# Patient Record
Sex: Female | Born: 1955 | Race: White | Hispanic: No | Marital: Married | Smoking: Current every day smoker
Health system: Southern US, Community
[De-identification: ages and names within clinical notes are randomized; demographics above are authoritative.]

## PROBLEM LIST (undated history)

## (undated) DIAGNOSIS — B029 Zoster without complications: Secondary | ICD-10-CM

## (undated) DIAGNOSIS — A630 Anogenital (venereal) warts: Secondary | ICD-10-CM

## (undated) DIAGNOSIS — E7800 Pure hypercholesterolemia, unspecified: Secondary | ICD-10-CM

## (undated) DIAGNOSIS — Z79890 Hormone replacement therapy (postmenopausal): Secondary | ICD-10-CM

## (undated) DIAGNOSIS — R7611 Nonspecific reaction to tuberculin skin test without active tuberculosis: Secondary | ICD-10-CM

## (undated) DIAGNOSIS — F17200 Nicotine dependence, unspecified, uncomplicated: Secondary | ICD-10-CM

## (undated) HISTORY — DX: Nicotine dependence, unspecified, uncomplicated: F17.200

## (undated) HISTORY — DX: Hormone replacement therapy: Z79.890

## (undated) HISTORY — DX: Nonspecific reaction to tuberculin skin test without active tuberculosis: R76.11

## (undated) HISTORY — DX: Anogenital (venereal) warts: A63.0

## (undated) HISTORY — DX: Pure hypercholesterolemia, unspecified: E78.00

## (undated) HISTORY — DX: Zoster without complications: B02.9

---

## 1994-02-07 HISTORY — PX: ESOPHAGUS SURGERY: SHX626

## 2001-02-07 HISTORY — PX: TONSILLECTOMY AND ADENOIDECTOMY: SUR1326

## 2001-02-07 HISTORY — PX: APPENDECTOMY: SHX54

## 2003-02-08 DIAGNOSIS — B029 Zoster without complications: Secondary | ICD-10-CM

## 2003-02-08 HISTORY — DX: Zoster without complications: B02.9

## 2003-02-08 LAB — HM DEXA SCAN

## 2006-09-07 ENCOUNTER — Ambulatory Visit: Payer: Self-pay

## 2007-11-13 ENCOUNTER — Ambulatory Visit: Payer: Self-pay | Admitting: Family Medicine

## 2008-09-09 LAB — HM PAP SMEAR: HM Pap smear: NORMAL

## 2011-11-22 ENCOUNTER — Ambulatory Visit (INDEPENDENT_AMBULATORY_CARE_PROVIDER_SITE_OTHER): Payer: BC Managed Care – PPO | Admitting: Family Medicine

## 2011-11-22 ENCOUNTER — Encounter: Payer: Self-pay | Admitting: Family Medicine

## 2011-11-22 VITALS — BP 140/84 | HR 94 | Temp 98.1°F | Resp 16 | Ht 65.0 in | Wt 125.0 lb

## 2011-11-22 DIAGNOSIS — R319 Hematuria, unspecified: Secondary | ICD-10-CM

## 2011-11-22 DIAGNOSIS — L989 Disorder of the skin and subcutaneous tissue, unspecified: Secondary | ICD-10-CM

## 2011-11-22 DIAGNOSIS — F419 Anxiety disorder, unspecified: Secondary | ICD-10-CM

## 2011-11-22 DIAGNOSIS — Z01419 Encounter for gynecological examination (general) (routine) without abnormal findings: Secondary | ICD-10-CM

## 2011-11-22 DIAGNOSIS — Z0000 Encounter for general adult medical examination without abnormal findings: Secondary | ICD-10-CM

## 2011-11-22 DIAGNOSIS — Z780 Asymptomatic menopausal state: Secondary | ICD-10-CM

## 2011-11-22 DIAGNOSIS — Z23 Encounter for immunization: Secondary | ICD-10-CM

## 2011-11-22 LAB — COMPREHENSIVE METABOLIC PANEL
AST: 17 U/L (ref 0–37)
Alkaline Phosphatase: 78 U/L (ref 39–117)
BUN: 10 mg/dL (ref 6–23)
Creat: 0.84 mg/dL (ref 0.50–1.10)
Glucose, Bld: 96 mg/dL (ref 70–99)
Total Bilirubin: 0.5 mg/dL (ref 0.3–1.2)

## 2011-11-22 LAB — POCT URINALYSIS DIPSTICK
Bilirubin, UA: NEGATIVE
Ketones, UA: NEGATIVE
Protein, UA: NEGATIVE
pH, UA: 5.5

## 2011-11-22 LAB — IFOBT (OCCULT BLOOD): IFOBT: NEGATIVE

## 2011-11-22 LAB — POCT UA - MICROSCOPIC ONLY
Crystals, Ur, HPF, POC: NEGATIVE
Yeast, UA: NEGATIVE

## 2011-11-22 LAB — LIPID PANEL
Cholesterol: 208 mg/dL — ABNORMAL HIGH (ref 0–200)
HDL: 66 mg/dL (ref >39)
Total CHOL/HDL Ratio: 3.2 Ratio
Triglycerides: 78 mg/dL (ref <150)
VLDL: 16 mg/dL (ref 0–40)

## 2011-11-22 LAB — CBC
HCT: 42.1 % (ref 36.0–46.0)
Hemoglobin: 14.2 g/dL (ref 12.0–15.0)
MCH: 29.2 pg (ref 26.0–34.0)
MCHC: 33.7 g/dL (ref 30.0–36.0)
RDW: 13.7 % (ref 11.5–15.5)

## 2011-11-22 LAB — HEMOGLOBIN A1C: Mean Plasma Glucose: 114 mg/dL (ref <117)

## 2011-11-22 MED ORDER — HYDROXYZINE HCL 25 MG PO TABS: ORAL_TABLET | ORAL | Status: DC

## 2011-11-22 MED ORDER — MEDROXYPROGESTERONE ACETATE 2.5 MG PO TABS: 2.5000 mg | ORAL_TABLET | Freq: Every day | ORAL | Status: DC

## 2011-11-22 MED ORDER — ESTRADIOL 0.5 MG PO TABS: 0.5000 mg | ORAL_TABLET | Freq: Every day | ORAL | Status: DC

## 2011-11-22 NOTE — Progress Notes (Signed)
[address]   [place], [place]  [address]   [phone]  Subjective:    Patient ID: Lindsey Rich, female    DOB: 07-13-1955, 56 y.o.   MRN: [id number]  HPIThis 56 y.o. female presents to establish care and for CPE.  Last physical 09/2008.  Pap smear 09/2008 normal HPV negative.  Mammogram 11/2007.  Colonoscopy never.  TDAP 2007.  Flu vaccine yearly.  Eye exam 2012; +glasses; no glaucoma or cataracts.  Dental exam 2006.    1.  Anxiety: onset eight months ago; three moves in past year.  Husband's job has moved him three times in past year.  Had to get rid of dog because of moves; [employer].  Intolerant to negative; becomes anxious.  Anxious while driving; constantly thinking; insomnia.  No HRT in past month.  Wakes up and has joint aches..+fatigue.  Getting seven hours of sleep per night.      Review of Systems  Constitutional: Negative for fever, chills, diaphoresis, activity change, appetite change, fatigue and unexpected weight change.  HENT: Negative for hearing loss, ear pain, nosebleeds, congestion, sore throat, facial swelling, rhinorrhea, sneezing, drooling, mouth sores, trouble swallowing, neck pain, neck stiffness, dental problem, voice change, postnasal drip, sinus pressure, tinnitus and ear discharge.   Eyes: Negative for photophobia, pain, discharge, redness, itching and visual disturbance.  Respiratory: Negative for apnea, cough, choking, chest tightness, shortness of breath, wheezing and stridor.   Cardiovascular: Negative for chest pain, palpitations and leg swelling.  Gastrointestinal: Negative for nausea, vomiting, abdominal pain, diarrhea, constipation, blood in stool, abdominal distention, anal bleeding and rectal pain.  Genitourinary: Negative for dysuria, urgency, frequency, hematuria, flank pain, decreased urine volume, vaginal bleeding, vaginal discharge, enuresis, difficulty urinating, genital sores, vaginal pain, menstrual problem, pelvic pain and dyspareunia.    Musculoskeletal: Negative for myalgias, back pain, joint swelling, arthralgias and gait problem.  Skin: Negative for color change, pallor, rash and wound.  Neurological: Negative for dizziness, tremors, seizures, syncope, facial asymmetry, speech difficulty, weakness, light-headedness, numbness and headaches.  Hematological: Negative for adenopathy. Does not bruise/bleed easily.  Psychiatric/Behavioral: Positive for sleep disturbance. Negative for suicidal ideas, hallucinations, behavioral problems, confusion, self-injury, dysphoric mood, decreased concentration and agitation. The patient is nervous/anxious. The patient is not hyperactive.         Past Medical History  Diagnosis Date  . Pure hypercholesterolemia   . Tobacco use disorder   . 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 Need for prophylactic hormone replacement therapy (postmenopausal) age 44    postmenopausal  . Genital warts   . Shingles 2005    Past Surgical History  Procedure Date  . Appendectomy 2003  . Esophagus surgery 1996  . Tonsillectomy and adenoidectomy 2003    Prior to Admission medications   Medication Sig Start Date End Date Taking? Authorizing Provider  cholecalciferol (VITAMIN D) 1000 UNITS tablet Take 1,000 Units by mouth daily.    Historical Provider, MD  estradiol (ESTRACE) 0.5 MG tablet Take 1 tablet (0.5 mg total) by mouth daily. 11/22/11  Yes Ethelda Chick, MD  hydrOXYzine (ATARAX/VISTARIL) 25 MG tablet 1/2 to 1 three times daily PRN anxiety 11/22/11   Ethelda Chick, MD  medroxyPROGESTERone (PROVERA) 2.5 MG tablet Take 1 tablet (2.5 mg total) by mouth daily. 11/22/11  Yes Ethelda Chick, MD    No Known Allergies  History   Social History  . Marital Status: Married    Spouse Name: N/A    Number  of Children: 2  . Years of Education: 12   Occupational History  . housekeeper    Social History Main Topics  . Smoking status: Current Every Day Smoker  . Smokeless tobacco: Not on file  .  Alcohol Use: No  . Drug Use: No  . Sexually Active: Not on file   Other Topics Concern  . Not on file   Social History Narrative   Married x 26 years. Happily married no abuse.Caffeine use: drinks four glasses of tea per ay. Exercise: Walks 1-3 times per week; weights  X 3 days.    Family History  Problem Relation Age of Onset  . Alcohol abuse Mother   . Heart attack Father   . Heart disease Father   . Hyperlipidemia Father   . Heart attack Maternal Grandmother   . Stroke Maternal Grandmother   . Cancer Sister     lung  . Hypertension Sister   . Cancer Sister   . Thyroid disease Sister   . Thyroid disease Brother   . Hyperlipidemia Brother     Objective:   Physical Exam  Nursing note and vitals reviewed. Constitutional: She is oriented to person, place, and time. She appears well-developed and well-nourished. No distress.  HENT:  Head: Normocephalic and atraumatic.  Right Ear: External ear normal.  Left Ear: External ear normal.  Nose: Nose normal.  Mouth/Throat: Oropharynx is clear and moist.  Eyes: Conjunctivae normal and EOM are normal. Pupils are equal, round, and reactive to light.  Neck: Normal range of motion. Neck supple. No JVD present. No tracheal deviation present. No thyromegaly present.  Cardiovascular: Normal rate, regular rhythm, normal heart sounds and intact distal pulses.  Exam reveals no gallop and no friction rub.   No murmur heard. Pulmonary/Chest: Effort normal and breath sounds normal. No respiratory distress. She has no wheezes. She has no rales.  Abdominal: Soft. Bowel sounds are normal. She exhibits no distension and no mass. There is tenderness. There is no rebound and no guarding.  Genitourinary: Vagina normal and uterus normal.  Musculoskeletal: Normal range of motion.  Lymphadenopathy:    She has no cervical adenopathy.  Neurological: She is alert and oriented to person, place, and time. She has normal reflexes.  Skin: Skin is warm and  dry. She is not diaphoretic.  Psychiatric: She has a normal mood and affect. Her behavior is normal. Judgment and thought content normal.    INFLUENZA VACCINE ADMINISTERED.    Assessment & Plan:   1. Routine general medical examination at a health care facility  POCT UA - Microscopic Only, POCT urinalysis dipstick, CBC, Comprehensive metabolic panel, Hemoglobin A1c, Lipid panel, TSH, T4, free, Vitamin B12, Folate, Vitamin D 25 hydroxy, EKG 12-Lead, Pulse oximetry (single), Pulmonary function test, IFOBT POC (occult bld, rslt in office)  2. Need for influenza vaccination  Flu vaccine greater than or equal to 3yo preservative free IM  3. Anxiety  hydrOXYzine (ATARAX/VISTARIL) 25 MG tablet  4. Menopause  estradiol (ESTRACE) 0.5 MG tablet, medroxyPROGESTERone (PROVERA) 2.5 MG tablet  5. Routine gynecological examination  MM Digital Screening, Pap IG and HPV (high risk) DNA detection  6. Hematuria  Urine culture  7. Skin lesion  Ambulatory referral to Dermatology     1.  CPE:  Anticipatory guidance --- exercise, weight maintenance, smoking cessation. Pap smear obtained; refer for mammogram.    S/p influenza vaccine in office.  Counseled on colon cancer screening; hemosure obtained in office.  Obtain labs. 2. Gynecology exam:  Completed; pap smear obtained; refer for mammogram. 3. Menopause:  Uncontrolled currently; counseled in HRT at length; refill of Estrace and Provera provided. 4.  Hematuria:  New. Send urine culture.  If culture negative, will need follow-up visit in 2-4 weeks to confirm resolution.   5.  Anxiety:  New.  Intermittent; agreeable to trial of Hydroxyzine PRN.  If needing daily, highly encourage psychotherapy and SSRI. 6.  Skin Lesion:  New.  Refer to dermatology for skin survey.    Meds ordered this encounter  Medications  . DISCONTD: medroxyPROGESTERone (PROVERA) 2.5 MG tablet    Sig: Take 2.5 mg by mouth daily.  Marland Kitchen DISCONTD: estradiol (ESTRACE) 0.5 MG tablet    Sig: Take  0.5 mg by mouth daily.  . hydrOXYzine (ATARAX/VISTARIL) 25 MG tablet    Sig: 1/2 to 1 three times daily PRN anxiety    Dispense:  60 tablet    Refill:  3  . estradiol (ESTRACE) 0.5 MG tablet    Sig: Take 1 tablet (0.5 mg total) by mouth daily.    Dispense:  30 tablet    Refill:  11  . medroxyPROGESTERone (PROVERA) 2.5 MG tablet    Sig: Take 1 tablet (2.5 mg total) by mouth daily.    Dispense:  30 tablet    Refill:  11

## 2011-11-23 LAB — T4, FREE: Free T4: 1.17 ng/dL (ref 0.80–1.80)

## 2011-11-23 LAB — FOLATE: Folate: >20 ng/mL

## 2011-11-24 LAB — URINE CULTURE: Colony Count: 7000

## 2011-11-24 LAB — PAP IG AND HPV HIGH-RISK

## 2011-11-27 ENCOUNTER — Telehealth: Payer: Self-pay

## 2011-11-27 NOTE — Telephone Encounter (Signed)
Patient returning call for lab results from OV on 10/15. She can be reached at [phone].

## 2011-11-28 NOTE — Telephone Encounter (Signed)
The lab has contacted her since this message was taken.

## 2011-12-02 ENCOUNTER — Encounter: Payer: Self-pay | Admitting: *Deleted

## 2011-12-30 ENCOUNTER — Encounter: Payer: Self-pay | Admitting: Family Medicine

## 2012-01-24 NOTE — Progress Notes (Signed)
Reviewed and agree.

## 2012-03-05 ENCOUNTER — Telehealth: Payer: Self-pay | Admitting: *Deleted

## 2012-03-05 NOTE — Telephone Encounter (Signed)
Please call pt --- why does she take Acyclovir?  Last filled at pharmacy 08/2011.

## 2012-03-05 NOTE — Telephone Encounter (Signed)
[facility] [place] requesting a refill on acyclovir 200mg  cap.  Last fill 08/10/11

## 2012-03-06 NOTE — Telephone Encounter (Signed)
Pt reports that she has had recurrent episodes of shingles and when she was seeing Dr Leanna Battles (?) at [facility] she had Rxd acyclovir for pt to take when she feels the Sxs of shingles beginning. Pt had filled out a form for records to be transferred here when she started coming here to see Dr Katrinka Blazing.

## 2012-03-06 NOTE — Telephone Encounter (Signed)
Called patient to advise, left message for her to call me back.

## 2012-03-06 NOTE — Telephone Encounter (Signed)
Pt CB and I advised her that Dr Katrinka Blazing will need to see her first. Pt may call and see if Dr Leanna Battles can RF it since she has seen her for this or will call back to get Dr Michaelle Copas schedule to come in.

## 2012-03-06 NOTE — Telephone Encounter (Signed)
I have never seen her for shingles nor have I provided with rx for Acyclovir; thus, she will need OV with me to get rx refilled.  KMS

## 2012-11-22 ENCOUNTER — Ambulatory Visit (INDEPENDENT_AMBULATORY_CARE_PROVIDER_SITE_OTHER): Payer: BC Managed Care – PPO | Admitting: Family Medicine

## 2012-11-22 VITALS — BP 148/90 | HR 96 | Temp 98.5°F | Resp 16 | Ht 66.0 in | Wt 128.0 lb

## 2012-11-22 DIAGNOSIS — W57XXXA Bitten or stung by nonvenomous insect and other nonvenomous arthropods, initial encounter: Secondary | ICD-10-CM

## 2012-11-22 DIAGNOSIS — R109 Unspecified abdominal pain: Secondary | ICD-10-CM

## 2012-11-22 DIAGNOSIS — T148 Other injury of unspecified body region: Secondary | ICD-10-CM

## 2012-11-22 LAB — POCT CBC
Granulocyte percent: 62.3 %G (ref 37–80)
HCT, POC: 40.5 % (ref 37.7–47.9)
Lymph, poc: 3.1 (ref 0.6–3.4)
MCHC: 31.4 g/dL — AB (ref 31.8–35.4)
MCV: 90.8 fL (ref 80–97)
MID (cbc): 0.7 (ref 0–0.9)
POC LYMPH PERCENT: 31.1 %L (ref 10–50)
Platelet Count, POC: 239 10*3/uL (ref 142–424)
RDW, POC: 14.2 %

## 2012-11-22 LAB — COMPREHENSIVE METABOLIC PANEL
ALT: 11 U/L (ref 0–35)
AST: 19 U/L (ref 0–37)
Albumin: 4.2 g/dL (ref 3.5–5.2)
Alkaline Phosphatase: 72 U/L (ref 39–117)
Potassium: 4.3 mEq/L (ref 3.5–5.3)
Sodium: 140 mEq/L (ref 135–145)
Total Bilirubin: 0.3 mg/dL (ref 0.3–1.2)
Total Protein: 7 g/dL (ref 6.0–8.3)

## 2012-11-22 LAB — POCT URINALYSIS DIPSTICK
Leukocytes, UA: NEGATIVE
Nitrite, UA: NEGATIVE
Protein, UA: NEGATIVE
Urobilinogen, UA: 0.2
pH, UA: 6.5

## 2012-11-22 LAB — POCT UA - MICROSCOPIC ONLY
Casts, Ur, LPF, POC: NEGATIVE
Crystals, Ur, HPF, POC: NEGATIVE
Yeast, UA: NEGATIVE

## 2012-11-22 MED ORDER — RANITIDINE HCL 150 MG PO TABS: 150.0000 mg | ORAL_TABLET | Freq: Every day | ORAL | Status: DC

## 2012-11-22 MED ORDER — OMEPRAZOLE 20 MG PO CPDR: 20.0000 mg | DELAYED_RELEASE_CAPSULE | Freq: Every day | ORAL | Status: DC

## 2012-11-22 NOTE — Progress Notes (Signed)
Subjective:  This chart was scribed for Lindsey Chick, MD by Lindsey Rich, ED scribe.  This patient was seen in room [facility] Room 2 and the patient's care was started at 4:23 PM.   Patient ID: Lindsey Rich, female    DOB: [birth year], 57 y.o.   MRN: [id number]  Chief Complaint  Patient presents with  . Abdominal Pain    possible ulcer    HPI  HPI Comments: Lindsey Rich is a 57 y.o. female who presents with a chief complaint of 2 months of intermittent, progressively-worsening burning and bloating periumbilical and epigastric abdominal pain.  Pt states the she thinks she has an ulcer.  Her pain initially felt like hunger pains, "like I needed to feed my stomach all the time," but eating did not relieve her pain.  Last month she began to feel a bloating and burning pain in her stomach.  She also states that the pain sometimes "feels like when a baby moves."  Pain is sometimes present on waking but does not wake her up at night.  It is not necessarily worse in the morning.  She has attempted to treat pain with Tums, with some transient relief.  She has not taken any more since then.  She is avoiding dairy and spicy food but states this does not relieve her pain.  She also states she sometimes feels "like I"m going to burp" when she lies down at night which is new for her.  Last month she had a couple days of diarrhea but none since then.  She states she sometimes has CP which she attributes to stress and which is not associated with exertion.  She denies nausea, vomiting, constipation, black or bloody stools, loss of appetite, SOB, fevers, chills, sweats, urinary symptoms, or weight loss.  Pt states she has h/o abdominal ulcer as a child under 12, and possibly another in her 25s which she treated with Maalox.  She has not taken Maalox for her current symptoms.  She denies NSAID usage.  She smokes less than one pack per day.  She walks 1.5 miles/week.  She denies alcohol use.  She drinks 1  caffeinated sweet tea each day but denies any other caffeine intake.    Pt states she also wants to be checked for H pylori and Lyme disease.  She states "I had so many ticks on me this year and they left scars everywhere and itched for 6-8 weeks afterwards"   She notes that her husband recommended she be checked for H. Pylori because brother-in-law has it.    Pt states that there have been multiple deaths in her family recently, including her other brother-in-law who died recently of cancer.  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    Past Medical History  Diagnosis Date  . Pure hypercholesterolemia   . Tobacco use disorder   . 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 Need for prophylactic hormone replacement therapy (postmenopausal) age 81    postmenopausal  . Genital warts   . Shingles 2005      Medication List       This list is accurate as of: 11/22/12  4:23 PM.  Always use your most recent med list.               estradiol 0.5 MG tablet  Commonly known as:  ESTRACE  Take 1 tablet (0.5 mg total) by mouth daily.  medroxyPROGESTERone 2.5 MG tablet  Commonly known as:  PROVERA  Take 1 tablet (2.5 mg total) by mouth daily.        Past Surgical History  Procedure Laterality Date  . Appendectomy  2003  . Esophagus surgery  1996  . Tonsillectomy and adenoidectomy  2003     No Known Allergies  History   Social History  . Marital Status: Married    Spouse Name: N/A    Number of Children: 2  . Years of Education: 12   Occupational History  . housekeeper    Social History Main Topics  . Smoking status: Current Every Day Smoker  . Smokeless tobacco: Not on file  . Alcohol Use: No  . Drug Use: No  . Sexual Activity: Not on file   Other Topics Concern  . Not on file   Social History Narrative   Married x 26 years. Happily married no abuse.Caffeine use: drinks four glasses of tea per ay. Exercise: Walks 1-3  times per week; weights  X 3 days.   Family History  Problem Relation Age of Onset  . Alcohol abuse Mother   . Heart attack Father   . Heart disease Father   . Hyperlipidemia Father   . Heart attack Maternal Grandmother   . Stroke Maternal Grandmother   . Cancer Sister     lung  . Hypertension Sister   . Cancer Sister   . Thyroid disease Sister   . Thyroid disease Brother   . Hyperlipidemia Brother     Review of Systems  Constitutional: Negative for fever, chills, diaphoresis, appetite change and unexpected weight change.  Respiratory: Negative for shortness of breath.   Cardiovascular: Positive for chest pain (not associated with other symptoms, pt attributes to stress).  Gastrointestinal: Positive for abdominal pain and diarrhea (transient). Negative for nausea, vomiting, constipation and blood in stool.  Genitourinary: Negative for dysuria, urgency, frequency, hematuria, decreased urine volume and difficulty urinating.      BP 148/90  Pulse 96  Temp(Src) 98.5 F (36.9 C) (Oral)  Resp 16  Ht 5\' 6"  (1.676 m)  Wt 128 lb (58.06 kg)  BMI 20.67 kg/m2  SpO2 98%  Objective:   Physical Exam  Nursing note and vitals reviewed. Constitutional: She appears well-developed and well-nourished. No distress.  HENT:  Mouth/Throat: Oropharynx is clear and moist. No oropharyngeal exudate.  Eyes: Conjunctivae and EOM are normal. Pupils are equal, round, and reactive to light.  Neck: Neck supple. No thyromegaly present.  Cardiovascular: Normal rate, regular rhythm and normal heart sounds.   No murmur heard. Pulmonary/Chest: Effort normal and breath sounds normal. No respiratory distress. She has no wheezes. She has no rales.  Abdominal: Soft. Bowel sounds are normal. There is no hepatosplenomegaly. There is tenderness in the epigastric area and periumbilical area. There is no rebound and no guarding.  Lymphadenopathy:    She has no cervical adenopathy.  Neurological: She is alert.   Skin: Skin is warm and dry.  Psychiatric: She has a normal mood and affect. Her behavior is normal.   Results for orders placed in visit on 11/22/12  POCT CBC      Result Value Range   WBC 10.1  4.6 - 10.2 K/uL   Lymph, poc 3.1  0.6 - 3.4   POC LYMPH PERCENT 31.1  10 - 50 %L   MID (cbc) 0.7  0 - 0.9   POC MID % 6.6  0 - 12 %M   POC Granulocyte  6.3  2 - 6.9   Granulocyte percent 62.3  37 - 80 %G   RBC 4.46  4.04 - 5.48 M/uL   Hemoglobin 12.7  12.2 - 16.2 g/dL   HCT, POC 09.8  11.9 - 47.9 %   MCV 90.8  80 - 97 fL   MCH, POC 28.5  27 - 31.2 pg   MCHC 31.4 (*) 31.8 - 35.4 g/dL   RDW, POC 14.7     Platelet Count, POC 239  142 - 424 K/uL   MPV 9.0  0 - 99.8 fL  POCT UA - MICROSCOPIC ONLY      Result Value Range   WBC, Ur, HPF, POC neg     RBC, urine, microscopic 0-1     Bacteria, U Microscopic neg     Mucus, UA neg     Epithelial cells, urine per micros 0-1     Crystals, Ur, HPF, POC neg     Casts, Ur, LPF, POC neg     Yeast, UA neg    POCT URINALYSIS DIPSTICK      Result Value Range   Color, UA yellow     Clarity, UA clear     Glucose, UA neg     Bilirubin, UA neg     Ketones, UA neg     Spec Grav, UA 1.010     Blood, UA trace     pH, UA 6.5     Protein, UA neg     Urobilinogen, UA 0.2     Nitrite, UA neg     Leukocytes, UA Negative         Assessment & Plan:   1. Abdominal pain, unspecified site   2. Tick bites     1.  Abdominal pain epigastric:  New.  Onset two months ago; ddx gastritis, PUD, H. Pylori, GERD.  Obtain labs.  Rx for Omeprazole and Zantac provided. BRAT/bland diet.  Follow-up in 4-6 weeks if no improvement and soon if worse. 2.  Multiple tick bites:  New.  Requesting Lyme's testing due to current symptoms.    Meds ordered this encounter  Medications  . omeprazole (PRILOSEC) 20 MG capsule    Sig: Take 1 capsule (20 mg total) by mouth daily.    Dispense:  30 capsule    Refill:  3  . ranitidine (ZANTAC) 150 MG tablet    Sig: Take 1 tablet  (150 mg total) by mouth at bedtime.    Dispense:  30 tablet    Refill:  0       I personally performed the services described in this documentation, which was scribed in my presence.  The recorded information has been reviewed and is accurate.

## 2012-11-22 NOTE — Patient Instructions (Signed)
Peptic Ulcer A peptic ulcer is a sore in the lining of in your esophagus (esophageal ulcer), stomach (gastric ulcer), or in the first part of your small intestine (duodenal ulcer). The ulcer causes erosion into the deeper tissue. CAUSES  Normally, the lining of the stomach and the small intestine protects itself from the acid that digests food. The protective lining can be damaged by:  An infection caused by a bacterium called Helicobacter pylori (H. pylori).  Regular use of nonsteroidal anti-inflammatory drugs (NSAIDs), such as ibuprofen or aspirin.  Smoking tobacco. Other risk factors include being older than 50, drinking alcohol excessively, and having a family history of ulcer disease.  SYMPTOMS   Burning pain or gnawing in the area between the chest and the belly button.  Heartburn.  Nausea and vomiting.  Bloating. The pain can be worse on an empty stomach and at night. If the ulcer results in bleeding, it can cause:  Black, tarry stools.  Vomiting of bright red blood.  Vomiting of coffee ground looking materials. DIAGNOSIS  A diagnosis is usually made based upon your history and an exam. Other tests and procedures may be performed to find the cause of the ulcer. Finding a cause will help determine the best treatment. Tests and procedures may include:  Blood tests, stool tests, or breath tests to check for the bacterium H. pylori.  An upper gastrointestinal (GI) series of the esophagus, stomach, and small intestine.  An endoscopy to examine the esophagus, stomach, and small intestine.  A biopsy. TREATMENT  Treatment may include:  Eliminating the cause of the ulcer, such as smoking, NSAIDs, or alcohol.  Medicines to reduce the amount of acid in your digestive tract.  Antibiotic medicines if the ulcer is caused by the H. pylori bacterium.  An upper endoscopy to treat a bleeding ulcer.  Surgery if the bleeding is severe or if the ulcer created a hole somewhere in the  digestive system. HOME CARE INSTRUCTIONS   Avoid tobacco, alcohol, and caffeine. Smoking can increase the acid in the stomach, and continued smoking will impair the healing of ulcers.  Avoid foods and drinks that seem to cause discomfort or aggravate your ulcer.  Only take medicines as directed by your caregiver. Do not substitute over-the-counter medicines for prescription medicines without talking to your caregiver.  Keep any follow-up appointments and tests as directed. SEEK MEDICAL CARE IF:   Your do not improve within 7 days of starting treatment.  You have ongoing indigestion or heartburn. SEEK IMMEDIATE MEDICAL CARE IF:   You have sudden, sharp, or persistent abdominal pain.  You have bloody or dark black, tarry stools.  You vomit blood or vomit that looks like coffee grounds.  You become light headed, weak, or feel faint.  You become sweaty or clammy. MAKE SURE YOU:   Understand these instructions.  Will watch your condition.  Will get help right away if you are not doing well or get worse. Document Released: 01/22/2000 Document Revised: 10/19/2011 Document Reviewed: 08/24/2011 [employer] Patient Information 2014 [employer].  

## 2012-11-23 LAB — B. BURGDORFI ANTIBODIES: B burgdorferi Ab IgG+IgM: 0.55 {ISR}

## 2012-11-28 ENCOUNTER — Encounter: Payer: BC Managed Care – PPO | Admitting: Family Medicine

## 2013-01-09 ENCOUNTER — Ambulatory Visit: Payer: BC Managed Care – PPO

## 2013-01-09 ENCOUNTER — Encounter: Payer: Self-pay | Admitting: Family Medicine

## 2013-01-09 ENCOUNTER — Ambulatory Visit (INDEPENDENT_AMBULATORY_CARE_PROVIDER_SITE_OTHER): Payer: BC Managed Care – PPO | Admitting: Family Medicine

## 2013-01-09 VITALS — BP 136/86 | HR 89 | Temp 98.5°F | Resp 16 | Ht 66.0 in | Wt 125.8 lb

## 2013-01-09 DIAGNOSIS — IMO0002 Reserved for concepts with insufficient information to code with codable children: Secondary | ICD-10-CM

## 2013-01-09 DIAGNOSIS — R1013 Epigastric pain: Secondary | ICD-10-CM

## 2013-01-09 DIAGNOSIS — H8110 Benign paroxysmal vertigo, unspecified ear: Secondary | ICD-10-CM

## 2013-01-09 DIAGNOSIS — M25561 Pain in right knee: Secondary | ICD-10-CM

## 2013-01-09 DIAGNOSIS — Z1239 Encounter for other screening for malignant neoplasm of breast: Secondary | ICD-10-CM

## 2013-01-09 DIAGNOSIS — M7051 Other bursitis of knee, right knee: Secondary | ICD-10-CM

## 2013-01-09 DIAGNOSIS — R319 Hematuria, unspecified: Secondary | ICD-10-CM

## 2013-01-09 DIAGNOSIS — M25569 Pain in unspecified knee: Secondary | ICD-10-CM

## 2013-01-09 DIAGNOSIS — Z8619 Personal history of other infectious and parasitic diseases: Secondary | ICD-10-CM

## 2013-01-09 LAB — POCT UA - MICROSCOPIC ONLY
Casts, Ur, LPF, POC: NEGATIVE
Crystals, Ur, HPF, POC: NEGATIVE
Mucus, UA: POSITIVE
Yeast, UA: NEGATIVE

## 2013-01-09 LAB — POCT URINALYSIS DIPSTICK
Bilirubin, UA: NEGATIVE
Ketones, UA: NEGATIVE
Nitrite, UA: NEGATIVE
Protein, UA: NEGATIVE
Spec Grav, UA: >=1.03
pH, UA: 6

## 2013-01-09 MED ORDER — OMEPRAZOLE 20 MG PO CPDR: 20.0000 mg | DELAYED_RELEASE_CAPSULE | Freq: Every day | ORAL | Status: DC

## 2013-01-09 MED ORDER — ACYCLOVIR 800 MG PO TABS: 800.0000 mg | ORAL_TABLET | Freq: Every day | ORAL | Status: AC

## 2013-01-09 MED ORDER — MECLIZINE HCL 25 MG PO TABS: 25.0000 mg | ORAL_TABLET | Freq: Three times a day (TID) | ORAL | Status: AC | PRN

## 2013-01-09 NOTE — Progress Notes (Signed)
   Subjective:    Patient ID: Lindsey Rich, female    DOB: Jan 10, 1956, 57 y.o.   MRN: [id number]  HPI    Review of Systems  Constitutional: Negative.   HENT: Negative.   Eyes: Negative.   Respiratory: Negative.   Cardiovascular: Negative.   Gastrointestinal: Negative.   Endocrine: Negative.   Genitourinary: Negative.   Musculoskeletal: Positive for arthralgias and joint swelling.  Skin: Negative.   Allergic/Immunologic: Negative.   Neurological: Negative.   Hematological: Negative.   Psychiatric/Behavioral: Negative.        Objective:   Physical Exam        Assessment & Plan:

## 2013-01-09 NOTE — Patient Instructions (Addendum)
1. Call [facility] at [phone].  Ask to be transferred to [facility] to schedule mammogram.  Pes Anserinus Syndrome with Rehab The pes anserine, also known as the goose's foot, is an area of the shinbone (tibia) near the knee joint where the tendons of three of the muscles of the thigh insert into the bone. These muscles are important for bending the knee and bringing the leg across the body. Just underneath the three tendons that attach at the pes anserinus exists a fluid filled sac (bursa) that is meant to reduce the friction between the tendons and the tibia. Pes anserinus syndrome is a condition that is characterized by inflammation of the bursa (bursitis) and/ or tendonitis (inflammation of the tendon) and may cause severe pain in the lower portion of the inner (medial) side of the knee. SYMPTOMS   Pain and inflammation over the lower portion of the medial side of the knee.  Pain that worsens as the duration of an activity increases.  Pain that worsens when bending the knee, especially against resistance.  A crackling sound (crepitation) when the tendon or bursa is moved or touched. CAUSES  Bursitis and tendonitis are usually characterized as overuse injuries. Common mechanisms of injury include:  Stress placed on the knee from a sudden increase in the intensity, frequency, or duration of training.  Direct trauma to the upper leg (less common). RISK INCREASES WITH:  Endurance sports (distance running or triathletes).  Making changes to or beginning a new training program.  Sports that place stress on the muscles that insert at the pes anserinus, such as those that require pivoting, cutting, or jumping.  Improper training.  Poor strength and flexibility  Failure to warm-up properly before activity.  Improper knee alignment ( knock knees).  Arthritis of the knee. PREVENTION  Warm up and stretch properly before activity.  Allow for  adequate recovery between workouts.  Maintain physical fitness:  Strength, flexibility, and endurance.  Cardiovascular fitness.  Learn and use training methods that will reduce the stress placed on the pes anserinus.  Arch supports (orthotics) may be helpful for those with flat feet. PROGNOSIS  If treated properly, then the symptoms of pes anserinus syndrome usually resolve within 6 weeks.  RELATED COMPLICATIONS   Persistent and potentially chronic pain if the condition is not treated properly.  Re-injury if activity is resumed before the injury is allowed to heal completely, or if one resumes improper training habits. TREATMENT Treatment initially involves the use of ice and medication to help reduce pain and inflammation. The use of strengthening and stretching exercises may help reduce pain with activity. These exercises may be performed at home or with a therapist. Individuals who have flat feet may find benefit in wearing arch supports in their shoes. Some individuals find that compression bandages or knee sleeves help reduce symptoms. Your caregiver may recommend a corticosteroid injection to help reduce inflammation. If symptoms persist, despite conservative treatment for greater than 6 months, then surgery may be recommended.  MEDICATION   If pain medication is necessary, then nonsteroidal anti-inflammatory medications, such as aspirin and ibuprofen, or other minor pain relievers, such as acetaminophen, are often recommended.  Do not take pain medication for 7 days before surgery.  Prescription pain relievers may be given if deemed necessary by your caregiver. Use only as directed and only as much as you need.  Corticosteroid injections may be given by your caregiver. These injections should be reserved for the most serious cases,  because they may only be given a certain number of times. SEEK MEDICAL CARE IF:  Treatment seems to offer no benefit, or the condition  worsens.  Any medications produce adverse side effects. EXERCISES  RANGE OF MOTION (ROM) AND STRETCHING EXERCISES - Pes Anserinus Syndrome These exercises may help you when beginning to rehabilitate your injury. Your symptoms may resolve with or without further involvement from your physician, physical therapist or athletic trainer. While completing these exercises, remember:   Restoring tissue flexibility helps normal motion to return to the joints. This allows healthier, less painful movement and activity.  An effective stretch should be held for at least 30 seconds.  A stretch should never be painful. You should only feel a gentle lengthening or release in the stretched tissue. STRETCH  Hamstrings, Supine  Lie on your back. Loop a belt or towel over the ball of your right / left foot.  Straighten your right / left knee and slowly pull on the belt to raise your leg. Do not allow the right / left knee to bend. Keep your opposite leg flat on the floor.  Raise the leg until you feel a gentle stretch behind your right / left knee or thigh. Hold this position for __________ seconds. Repeat __________ times. Complete this stretch __________ times per day.  STRETCH - Hamstrings, Doorway  Lie on your back with your right / left leg extended and resting on the wall and the opposite leg flat on the ground through the door. Initially, position your bottom farther away from the wall than the illustration shows.  Keep your right / left knee straight. If you feel a stretch behind your knee or thigh, hold this position for __________ seconds.  If you do not feel a stretch, scoot your bottom closer to the door, and hold __________ seconds. Repeat __________ times. Complete this stretch __________ times per day.  STRETCH - Hamstrings/Adductors, V-Sit  Sit on the floor with your legs extended in a large "V," keeping your knees straight.  With your head and chest upright, bend at your waist reaching  for your right foot to stretch your left adductors.  You should feel a stretch in your left inner thigh. Hold for __________ seconds.  Return to the upright position to relax your leg muscles.  Continuing to keep your chest upright, bend straight forward at your waist to stretch your hamstrings.  You should feel a stretch behind both of your thighs and/or knees. Hold for __________ seconds.  Return to the upright position to relax your leg muscles.  Repeat steps 2 through 4. Repeat __________ times. Complete this exercise __________ times per day.  STRETCH - Hamstrings, Standing  Stand or sit and extend your right / left leg, placing your foot on a chair or foot stool  Keeping a slight arch in your low back and your hips straight forward.  Lead with your chest and lean forward at the waist until you feel a gentle stretch in the back of your right / left knee or thigh. (When done correctly, this exercise requires leaning only a small distance.)  Hold this position for __________ seconds. Repeat __________ times. Complete this stretch __________ times per day. STRETCH - Adductors, Lunge  While standing, spread your legs  Lean away from your right / left leg by bending your opposite knee. You may rest your hands on your thigh for balance.  You should feel a stretch in your right / left inner thigh. Hold for __________  seconds. Repeat __________ times. Complete this exercise __________ times per day.  STRETCH - Adductors, Standing  Place your right / left foot on a counter or stable table. Turn away from your leg so both hips line up with your right / left leg.  Keeping your hips facing forward, slowly bend your opposite leg until you feel a gentle stretch on the inside of your right / left thigh.  Hold for __________ seconds. Repeat __________ times. Complete this exercise __________ times per day.  STRENGTHENING EXERCISES - Pes Anserinus Syndrome  These exercises may help you  when beginning to rehabilitate your injury. They may resolve your symptoms with or without further involvement from your physician, physical therapist or athletic trainer. While completing these exercises, remember:   Muscles can gain both the endurance and the strength needed for everyday activities through controlled exercises.  Complete these exercises as instructed by your physician, physical therapist or athletic trainer. Progress the resistance and repetitions only as guided. STRENGTH - Hamstring, Curls  Lay on your stomach with your legs extended. (If you lay on a bed, your feet may hang over the edge.)  Tighten the muscles in the back of your thigh to bend your right / left knee up to 90 degrees. Keep your hips flat on the bed/floor.  Hold this position for __________ seconds.  Slowly lower your leg back to the starting position. Repeat __________ times. Complete this exercise __________ times per day.  OPTIONAL ANKLE WEIGHTS: Begin with ____________________, but DO NOT exceed ____________________. Increase in 1 lb/0.5 kg increments.  STRENGTH - Hip Adductors, Straight Leg Raises  Lie on your side so that your head, shoulders, knee and hip line up. You may place your upper foot in front to help maintain your balance. Your right / left leg should be on the bottom.  Roll your hips slightly forward, so that your hips are stacked directly over each other and your right / left knee is facing forward.  Tense the muscles in your inner thigh and lift your bottom leg 4-6 inches. Hold this position for __________ seconds.  Slowly lower your leg to the starting position. Allow the muscles to fully relax before beginning the next repetition. Repeat __________ times. Complete this exercise __________ times per day.  Document Released: 01/24/2005 Document Revised: 04/18/2011 Document Reviewed: 05/08/2008 [facility] Patient Information 2014 [place], [place].

## 2013-01-09 NOTE — Progress Notes (Signed)
Subjective:    Patient ID: Lindsey Rich, female    DOB: 09-25-1955, 57 y.o.   MRN: [id number]  HPI This 57 y.o. female presents for CPE but desires to have an acute visit:   1.  Abdominal pain epigastric: six week follow-up; sometimes feels bubble; feels mass above the belly button.  Feels when lies down.  Intermittent mass; stomach will get hard.  Husband tested + for H. Pylori.  Zantac made pt horribly constipated so stopped it; took Zantac one month.   Taking Omeprazole daily; no further stomach pain after the fourth week of medication.  History of EGD in past; husband undergoing EGD this week.  2.  Vertigo: onset one week ago; fourth time/episode in four years.  Got a lot of water in L ear with hairdresser.  Awoke with severe spinning of room. +nausea.  Stayed in bed for three hours.  Bought Dramamine for three days. Would like rx for Meclizine.  Duration 3 days.  Turning head made worse.  Laiyng down made worse; closing eyes made worse.  No hearing loss; no tinnitus; no vomiting.  No headache. No n/t.  No confusion; no fever.    3. Shingles: history.  R lower back and into R leg; history of acyclovir rx.  Afraid of recurrent shingles.   4.  B knees and swelling:  Onset two months ago.   Swelling at baseline.  One year ago, had a bump on R lower patellar region.  Pain in shins with prolonged ambulation.   No popping; +giving out L. No injury.  No medications.  Started working at [employer].  No icing.    Review of Systems  Constitutional: Negative for fever, chills, diaphoresis, activity change, appetite change, fatigue and unexpected weight change.  HENT: Negative for congestion, ear discharge, ear pain, hearing loss, postnasal drip, rhinorrhea, sinus pressure, sneezing and tinnitus.   Gastrointestinal: Positive for constipation. Negative for nausea, vomiting, abdominal pain, diarrhea and blood in stool.  Musculoskeletal: Positive for arthralgias, gait problem and  joint swelling. Negative for myalgias.  Skin: Negative for rash.  Neurological: Positive for dizziness and numbness. Negative for tremors, seizures, syncope, facial asymmetry, speech difficulty, weakness, light-headedness and headaches.   Past Medical History  Diagnosis Date  . Pure hypercholesterolemia   . Tobacco use disorder   . 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 Need for prophylactic hormone replacement therapy (postmenopausal) age 78    postmenopausal  . Genital warts   . Shingles 2005   Not on File Current Outpatient Prescriptions on File Prior to Visit  Medication Sig Dispense Refill  . estradiol (ESTRACE) 0.5 MG tablet Take 1 tablet (0.5 mg total) by mouth daily.  30 tablet  11  . medroxyPROGESTERone (PROVERA) 2.5 MG tablet Take 1 tablet (2.5 mg total) by mouth daily.  30 tablet  11  . ranitidine (ZANTAC) 150 MG tablet Take 1 tablet (150 mg total) by mouth at bedtime.  30 tablet  0   No current facility-administered medications on file prior to visit.   History   Social History  . Marital Status: Married    Spouse Name: N/A    Number of Children: 2  . Years of Education: 12   Occupational History  . housekeeper    Social History Main Topics  . Smoking status: Current Every Day Smoker  . Smokeless tobacco: Not on file  . Alcohol Use: No  . Drug Use: No  . Sexual  Activity: Not on file   Other Topics Concern  . Not on file   Social History Narrative   Marital status:  Married x 26 years. Happily married no abuse.      Employment: working part-time at [employer] in [employer].      Caffeine use: drinks one glasses of tea per day.       Exercise: Walks 1-3 times per week; weights  X 3 days.       Objective:   Physical Exam  Nursing note and vitals reviewed. Constitutional: She is oriented to person, place, and time. She appears well-developed and well-nourished. No distress.  HENT:  Head: Normocephalic and atraumatic.  Right Ear: External  ear normal.  Left Ear: External ear normal.  Nose: Nose normal.  Mouth/Throat: Oropharynx is clear and moist.  Eyes: Conjunctivae and EOM are normal. Pupils are equal, round, and reactive to light.  Neck: Normal range of motion. Neck supple. No thyromegaly present.  Cardiovascular: Normal rate, regular rhythm, normal heart sounds and intact distal pulses.  Exam reveals no gallop and no friction rub.   No murmur heard. Pulmonary/Chest: Effort normal and breath sounds normal. She has no wheezes. She has no rales.  Abdominal: Soft. Bowel sounds are normal. She exhibits no distension and no mass. There is no tenderness. There is no rebound and no guarding.  Musculoskeletal: She exhibits tenderness.       Right knee: She exhibits bony tenderness. She exhibits normal range of motion, no swelling, no ecchymosis and no erythema. Tenderness found. Medial joint line, lateral joint line and patellar tendon tenderness noted.       Left knee: She exhibits normal range of motion, no swelling and no effusion. No tenderness found.  Lymphadenopathy:    She has no cervical adenopathy.  Neurological: She is alert and oriented to person, place, and time.  Skin: Skin is warm and dry. No rash noted. She is not diaphoretic.  Psychiatric: She has a normal mood and affect. Her behavior is normal. Judgment and thought content normal.   [facility] reading (PRIMARY) by  Dr. Katrinka Blazing.  R KNEE: mild medial narrowing; NAD.  Results for orders placed in visit on 01/09/13  URINE CULTURE      Result Value Ref Range   Colony Count NO GROWTH     Organism ID, Bacteria NO GROWTH    POCT URINALYSIS DIPSTICK      Result Value Ref Range   Color, UA yellow     Clarity, UA clear     Glucose, UA neg     Bilirubin, UA neg     Ketones, UA neg     Spec Grav, UA >=1.030     Blood, UA small     pH, UA 6.0     Protein, UA neg     Urobilinogen, UA 0.2     Nitrite, UA neg     Leukocytes, UA small (1+)    POCT UA - MICROSCOPIC ONLY       Result Value Ref Range   WBC, Ur, HPF, POC tntc     RBC, urine, microscopic 5-7     Bacteria, U Microscopic 2+     Mucus, UA pos     Epithelial cells, urine per micros 2-4     Crystals, Ur, HPF, POC neg     Casts, Ur, LPF, POC neg     Yeast, UA neg         Assessment & Plan:  Hematuria -  Plan: Urine culture  Breast cancer screening - Plan: MM Digital Screening  Pain in right knee - Plan: DG Knee Complete 4 Views Right  Abdominal pain, epigastric - Plan: POCT urinalysis dipstick, POCT UA - Microscopic Only  Benign paroxysmal positional vertigo  History of shingles  Pes anserinus bursitis of both knees  1. Hematuria: New.  Send urine culture; if negative, return to office for repeat u/a in upcoming month. 2.  Breast cancer screening:  Refer for mammogram. 3.  R knee pain: New.  Recommend icing, stretches. 4.  Bursitis B knees:  New.  Recommend rest, ice, stretches; recommend ortho consultation if no improvement in 2 months. 5.  Abdominal pain epigastric: improved; refill of Prilosec provided. Dietary modification reviewed. 6. History of shingles: rx for Acyclovir provided. 7. Benign positional vertigo:  New to this provider; rx for Meclizine provided for PRN use.   Meds ordered this encounter  Medications  . DISCONTD: omeprazole (PRILOSEC) 20 MG capsule    Sig: Take 1 capsule (20 mg total) by mouth daily.    Dispense:  30 capsule    Refill:  3  . acyclovir (ZOVIRAX) 800 MG tablet    Sig: Take 1 tablet (800 mg total) by mouth 5 (five) times daily.    Dispense:  40 tablet    Refill:  0  . meclizine (ANTIVERT) 25 MG tablet    Sig: Take 1 tablet (25 mg total) by mouth 3 (three) times daily as needed for dizziness.    Dispense:  30 tablet    Refill:  0   Nilda Simmer, M.D.  [facility] [address] [place], [place]  [address] [phone] phone [phone] fax

## 2013-01-11 ENCOUNTER — Encounter: Payer: Self-pay | Admitting: Family Medicine

## 2013-01-14 ENCOUNTER — Telehealth: Payer: Self-pay

## 2013-01-14 NOTE — Telephone Encounter (Signed)
Call patient back ---- 1.  At her visit, I ran her urine for urine culture to see if she had an infection that was causing the blood in her urine; the urine culture was negative for infection; no evidence of UTI as cause of blood in the urine.  2.  Recommend repeating a urine in upcoming 1-2 months since her physical appointment is not until April 2015; I will have scheduling contact her for an appointment in 1-2 months.

## 2013-01-14 NOTE — Telephone Encounter (Signed)
Pt.notified

## 2013-01-14 NOTE — Telephone Encounter (Signed)
PT STATES SHE RECEIVED A CALL REGARDING HER LABS AND HAVE BLOOD IN HER URINE, SHE KNOW SHE HAVE A BLADDER INFECTION AND WOULD LIKE [place] TO CALL HER SOMETHING IN PLEASE CALL [phone]    [facility] ON [address]

## 2013-01-16 NOTE — Telephone Encounter (Signed)
Left message for patient to call regarding scheduling appointment.

## 2013-01-28 ENCOUNTER — Other Ambulatory Visit: Payer: Self-pay

## 2013-01-28 DIAGNOSIS — Z780 Asymptomatic menopausal state: Secondary | ICD-10-CM

## 2013-01-28 NOTE — Telephone Encounter (Signed)
Pharm also reqs provera. Also pended for review.

## 2013-01-28 NOTE — Telephone Encounter (Signed)
Pharm reqs RFs of Estradiol. Dr Katrinka Blazing, you just saw pt but don't see notes on this med at [facility]. Can we get pt RFs?

## 2013-01-29 MED ORDER — ESTRADIOL 0.5 MG PO TABS: 0.5000 mg | ORAL_TABLET | Freq: Every day | ORAL | Status: DC

## 2013-01-29 MED ORDER — MEDROXYPROGESTERONE ACETATE 2.5 MG PO TABS
2.5000 mg | ORAL_TABLET | Freq: Every day | ORAL | Status: DC
Start: 2013-01-28 — End: 2013-03-20

## 2013-03-20 ENCOUNTER — Ambulatory Visit (INDEPENDENT_AMBULATORY_CARE_PROVIDER_SITE_OTHER): Payer: BC Managed Care – PPO | Admitting: Family Medicine

## 2013-03-20 ENCOUNTER — Encounter: Payer: Self-pay | Admitting: Family Medicine

## 2013-03-20 VITALS — BP 144/83 | HR 86 | Temp 98.3°F | Resp 16 | Ht 66.5 in | Wt 128.6 lb

## 2013-03-20 DIAGNOSIS — Z1211 Encounter for screening for malignant neoplasm of colon: Secondary | ICD-10-CM

## 2013-03-20 DIAGNOSIS — Z01419 Encounter for gynecological examination (general) (routine) without abnormal findings: Secondary | ICD-10-CM

## 2013-03-20 DIAGNOSIS — Z780 Asymptomatic menopausal state: Secondary | ICD-10-CM

## 2013-03-20 DIAGNOSIS — Z0000 Encounter for general adult medical examination without abnormal findings: Secondary | ICD-10-CM

## 2013-03-20 DIAGNOSIS — R319 Hematuria, unspecified: Secondary | ICD-10-CM

## 2013-03-20 LAB — POCT URINALYSIS DIPSTICK
Bilirubin, UA: NEGATIVE
Glucose, UA: NEGATIVE
Ketones, UA: NEGATIVE
NITRITE UA: NEGATIVE
PH UA: 6.5
PROTEIN UA: NEGATIVE
Spec Grav, UA: 1.01
Urobilinogen, UA: 0.2

## 2013-03-20 LAB — POCT UA - MICROSCOPIC ONLY
CRYSTALS, UR, HPF, POC: NEGATIVE
Casts, Ur, LPF, POC: NEGATIVE
Mucus, UA: NEGATIVE
YEAST UA: NEGATIVE

## 2013-03-20 LAB — IFOBT (OCCULT BLOOD): IMMUNOLOGICAL FECAL OCCULT BLOOD TEST: NEGATIVE

## 2013-03-20 MED ORDER — ESTRADIOL 0.5 MG PO TABS
0.5000 mg | ORAL_TABLET | Freq: Every day | ORAL | Status: DC
Start: 2013-03-20 — End: 2014-04-10

## 2013-03-20 MED ORDER — MEDROXYPROGESTERONE ACETATE 2.5 MG PO TABS: 2.5000 mg | ORAL_TABLET | Freq: Every day | ORAL | Status: DC

## 2013-03-20 NOTE — Progress Notes (Signed)
Subjective:   This chart was scribed for Lindsey Honour, MD by [name], [facility] Scribe. This patient was seen in room 22 and the patient's care was started 4:41 PM.   Patient ID: Lindsey Rich, female    DOB: 1955-03-15, 58 y.o.   MRN: [id number]  03/20/2013  Hematuria   HPI  HPI Comments: Lindsey Rich is a 58 y.o. female who presents to [facility] seeking follow up and Pap Smear today.   Pt denies any noticeable blood in her urine at this time.  Pt reports complete relief from her back pain she presented with last visit.  She states her knees are about 50% better, but reports mild swelling bilaterally.  She reports a decrease in hearing, but denies any tinnitus.  She admits to intermittent mild HA's, and smokers cough.  She reports always wearing her seatbelt when driving.  She states she typically goes to bed around 10 PM, and denies having any issues falling asleep. She denies ever having to get up in the middle of the night to use the rest room.  She denies any CP, racing, or skipping of beats.  At this time she denies any SOB, abdominal pain, constipation, nausea, vomiting,  Vaginal bleeding, diaphoresis, weight lose, bloody or black stools.  She denies checking her breast regularly.  Denies any emotional issues at this time.  She denies any structured physical activity at this time, but lifts small weights occasionally.  She denies any new surgeries this year.  She denies any guns in the home.  Mother died at 91 of alcohol. Father died of an MI, first one at 58. Both sisters passed at 28 of stomach cancer, and 78 (diagnosed at 7) of breast cancer. Pt has a 3rd sister age 43 who is still living, but is a lung cancer survivor. Brother is 28 and in good health.  She has been married 27 years. Denies any abuse. She currently lives with her husband and dog. She has 2 children ages 62 and 41. She has 2 grandchildren ages  65 and 45. She works part time at [employer].  She smokes about 1 pack a day, and started smoking when she was 58 years old. Denies any alcohol or illicit drug use.  Pt is due for Estrace and Provera refill.   Last Physical Exam: 11/22/11 Last Dental Visit: 5 years ago Last Eye Visit:4 years ago Last Colonoscopy: Pt has never had one. She states she is not ready just yet. Tetanus: 2007 Hep B: 2014 MMR: 2014 Last Flu Vaccination: 2014 Last Mammogram: Pt has an appointment 03/29/13 Last Pap Smear: 2013  Review of Systems  Constitutional: Negative for fever, chills, diaphoresis, activity change, appetite change, fatigue and unexpected weight change.  HENT: Negative for congestion, dental problem, drooling, ear discharge, ear pain, facial swelling, hearing loss, mouth sores, nosebleeds, postnasal drip, rhinorrhea, sinus pressure, sneezing, sore throat, tinnitus, trouble swallowing and voice change.   Eyes: Negative for photophobia, pain, discharge, redness, itching and visual disturbance.  Respiratory: Positive for cough. Negative for apnea, choking, chest tightness, shortness of breath, wheezing and stridor.   Cardiovascular: Negative for chest pain, palpitations and leg swelling.  Gastrointestinal: Negative for nausea, vomiting, abdominal pain, diarrhea, constipation, blood in stool, abdominal distention, anal bleeding and rectal pain.  Endocrine: Negative for cold intolerance, heat intolerance, polydipsia, polyphagia and polyuria.  Genitourinary: Negative for dysuria, urgency, frequency, hematuria, flank pain, decreased urine volume, vaginal bleeding, vaginal discharge,  enuresis, difficulty urinating, genital sores, vaginal pain, menstrual problem, pelvic pain and dyspareunia.  Musculoskeletal: Negative for myalgias, back pain, joint swelling, arthralgias, gait problem, neck pain and neck stiffness.  Skin: Negative for color change, pallor, rash and wound.  Allergic/Immunologic: Negative for  environmental allergies, food allergies and immunocompromised state.  Neurological: Positive for headaches. Negative for dizziness, tremors, seizures, syncope, facial asymmetry, speech difficulty, weakness, light-headedness and numbness.  Hematological: Negative for adenopathy. Does not bruise/bleed easily.  Psychiatric/Behavioral: Negative for suicidal ideas, hallucinations, behavioral problems, confusion, sleep disturbance, self-injury, dysphoric mood, decreased concentration and agitation. The patient is not nervous/anxious and is not hyperactive.   All other systems reviewed and are negative.   Past Medical History  Diagnosis Date  . Pure hypercholesterolemia   . Tobacco use disorder   . 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 Need for prophylactic hormone replacement therapy (postmenopausal) age 55    postmenopausal  . Genital warts   . Shingles 2005   No Known Allergies Current Outpatient Prescriptions  Medication Sig Dispense Refill  . acyclovir (ZOVIRAX) 800 MG tablet Take 1 tablet (800 mg total) by mouth 5 (five) times daily. 40 tablet 0  . estradiol (ESTRACE) 0.5 MG tablet Take 1 tablet (0.5 mg total) by mouth daily. 30 tablet 11  . meclizine (ANTIVERT) 25 MG tablet Take 1 tablet (25 mg total) by mouth 3 (three) times daily as needed for dizziness. 30 tablet 0  . medroxyPROGESTERone (PROVERA) 2.5 MG tablet Take 1 tablet (2.5 mg total) by mouth daily. 30 tablet 11   No current facility-administered medications for this visit.       Objective:    BP 144/83  Pulse 86  Temp(Src) 98.3 F (36.8 C)  Resp 16  Ht 5' 6.5" (1.689 m)  Wt 128 lb 9.6 oz (58.333 kg)  BMI 20.45 kg/m2  SpO2 99%  Physical Exam  Constitutional: She is oriented to person, place, and time. She appears well-developed and well-nourished. No distress.  HENT:  Head: Normocephalic and atraumatic.  Right Ear: External ear normal.  Left Ear: External ear normal.  Nose: Nose normal.  Mouth/Throat:  Oropharynx is clear and moist.  Eyes: Conjunctivae and EOM are normal. Pupils are equal, round, and reactive to light. Right eye exhibits no discharge. No scleral icterus.  Neck: Normal range of motion and full passive range of motion without pain. Neck supple. No JVD present. Carotid bruit is not present. No thyromegaly present.  Cardiovascular: Normal rate, regular rhythm, normal heart sounds and intact distal pulses.  Exam reveals no gallop and no friction rub.   No murmur heard. Pulmonary/Chest: Effort normal and breath sounds normal. No respiratory distress. She has no wheezes. She has no rales. She exhibits no tenderness.  Abdominal: Soft. Bowel sounds are normal. She exhibits no distension and no mass. There is no tenderness. There is no rebound and no guarding.  Genitourinary: Vagina normal.  Musculoskeletal: Normal range of motion. She exhibits no edema or tenderness.       Right shoulder: Normal.       Left shoulder: Normal.       Cervical back: Normal.  Lymphadenopathy:    She has no cervical adenopathy.  Neurological: She is alert and oriented to person, place, and time. She has normal reflexes. No cranial nerve deficit. She exhibits normal muscle tone. Coordination normal.  Skin: Skin is warm and dry. No rash noted. She is not diaphoretic. No erythema. No pallor.  Psychiatric: She  has a normal mood and affect. Her behavior is normal. Judgment and thought content normal.  Nursing note and vitals reviewed.  Results for orders placed or performed in visit on 03/20/13  POCT UA - Microscopic Only  Result Value Ref Range   WBC, Ur, HPF, POC 7-13    RBC, urine, microscopic 5-7    Bacteria, U Microscopic trace    Mucus, UA neg    Epithelial cells, urine per micros 2-3    Crystals, Ur, HPF, POC neg    Casts, Ur, LPF, POC neg    Yeast, UA neg   POCT urinalysis dipstick  Result Value Ref Range   Color, UA yellow    Clarity, UA clear    Glucose, UA neg    Bilirubin, UA neg     Ketones, UA neg    Spec Grav, UA 1.010    Blood, UA trace    pH, UA 6.5    Protein, UA neg    Urobilinogen, UA 0.2    Nitrite, UA neg    Leukocytes, UA small (1+)   IFOBT POC (occult bld, rslt in office)  Result Value Ref Range   IFOBT Negative   Pap IG w/ reflex to HPV when ASC-U  Result Value Ref Range   Specimen adequacy:     FINAL DIAGNOSIS:     COMMENTS:     Cytotechnologist:         Assessment & Plan:  Blood in urine - Plan: POCT UA - Microscopic Only, POCT urinalysis dipstick, Ambulatory referral to Urology  Routine gynecological examination - Plan: Pap IG w/ reflex to HPV when ASC-U  Routine general medical examination at a health care facility  Colon cancer screening - Plan: IFOBT POC (occult bld, rslt in office)  Menopause - Plan: estradiol (ESTRACE) 0.5 MG tablet, medroxyPROGESTERone (PROVERA) 2.5 MG tablet   1. Complete Physical Examination: anticipatory guidance --- smoking cessation, exercise. Pap smear obtained; mammogram scheduled; hemosure obtained; pt refuses colonoscopy.  Immunizations reviewed and UTD. 2.  Gynecological exam: completed; pap smear obtained; mammogram scheduled; refill of Estrace and provera provided. 3.  Colon cancer screening: hemosure obtained and negative; pt refuses colonoscopy. 4.  Hematuria: persistent; refer to urology. 5.  Menopause: stable; refill of medication.  With plan to wean medication by age 43. 68. Tobacco abuse: precontemplative.  Meds ordered this encounter  Medications  . estradiol (ESTRACE) 0.5 MG tablet    Sig: Take 1 tablet (0.5 mg total) by mouth daily.    Dispense:  30 tablet    Refill:  11  . medroxyPROGESTERone (PROVERA) 2.5 MG tablet    Sig: Take 1 tablet (2.5 mg total) by mouth daily.    Dispense:  30 tablet    Refill:  11    No Follow-up on file.    I personally performed the services described in this documentation, which was scribed in my presence.  The recorded information has been reviewed and  is accurate.  Reginia Forts, M.D.  [facility] [address] [place], [place]  [address] [phone] phone [phone] fax

## 2013-03-21 LAB — PAP IG W/ RFLX HPV ASCU

## 2013-05-15 ENCOUNTER — Ambulatory Visit: Payer: BC Managed Care – PPO | Admitting: Family Medicine

## 2014-02-17 ENCOUNTER — Ambulatory Visit: Payer: Self-pay

## 2014-02-17 ENCOUNTER — Other Ambulatory Visit: Payer: Self-pay | Admitting: Occupational Medicine

## 2014-02-17 DIAGNOSIS — M25512 Pain in left shoulder: Secondary | ICD-10-CM

## 2014-04-09 ENCOUNTER — Ambulatory Visit (INDEPENDENT_AMBULATORY_CARE_PROVIDER_SITE_OTHER): Payer: No Typology Code available for payment source | Admitting: Family Medicine

## 2014-04-09 ENCOUNTER — Encounter: Payer: Self-pay | Admitting: Family Medicine

## 2014-04-09 VITALS — BP 142/80 | HR 97 | Temp 98.2°F | Resp 16 | Ht 66.0 in | Wt 137.0 lb

## 2014-04-09 DIAGNOSIS — Z780 Asymptomatic menopausal state: Secondary | ICD-10-CM

## 2014-04-09 DIAGNOSIS — R030 Elevated blood-pressure reading, without diagnosis of hypertension: Secondary | ICD-10-CM

## 2014-04-09 DIAGNOSIS — K5900 Constipation, unspecified: Secondary | ICD-10-CM

## 2014-04-09 DIAGNOSIS — F17200 Nicotine dependence, unspecified, uncomplicated: Secondary | ICD-10-CM

## 2014-04-09 DIAGNOSIS — N951 Menopausal and female climacteric states: Secondary | ICD-10-CM | POA: Insufficient documentation

## 2014-04-09 DIAGNOSIS — Z9289 Personal history of other medical treatment: Secondary | ICD-10-CM

## 2014-04-09 LAB — TSH: TSH: 2.178 u[IU]/mL (ref 0.350–4.500)

## 2014-04-09 NOTE — Progress Notes (Signed)
Subjective:    Patient ID: Lindsey Rich, female    DOB: Mar 26, 1955, 59 y.o.   MRN: [id number]  HPI  Lindsey Rich a 59 year old female presents to establish care. She Rich she was a patient of Dr. Nilda Simmer at [facility], but was unable to continue due to insurance constraints. She Rich that she feels well and has minimal complaints.   Patient is complaining of menopausal symptoms. She Rich that she was previously on Estradiol and Medroxyprogesterone daily for 10 years. She stopped taking medications 1 year ago due to health insurance constraints. Primary menopausal symptoms include vaginal dryness and occasional night sweats.  Patient also complains of constipation.  Stool pattern has been 3 formed stool(s) per week. Onset was several weeks ago Defecation has been difficult. . Symptoms have been intermittent.  She Rich that her diet is balanced, she is active, and drinks water.  Past Medical History  Diagnosis Date  . Pure hypercholesterolemia   . Tobacco use disorder   . 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 Need for prophylactic hormone replacement therapy (postmenopausal) age 77    postmenopausal  . Genital warts   . Shingles 2005   History   Social History  . Marital Status: Married    Spouse Name: N/A  . Number of Children: 2  . Years of Education: 12   Occupational History  . housekeeper     [employer]   Social History Main Topics  . Smoking status: Current Every Day Smoker  . Smokeless tobacco: Not on file  . Alcohol Use: No  . Drug Use: No  . Sexual Activity: Yes   Other Topics Concern  . Not on file   Social History Narrative   Marital status:  Married x 27 years. Happily married; no abuse.      Lives: with husband, dog.      Children:  2 children (36, 29); 2 grandchildren (9, 5).  [place] and [place].      Employment: working part-time at [employer] in [employer].  Not happy.      Tobacco:  1 ppd x 47 years.       Alcohol: none      Drugs; none      Caffeine use: drinks one glasses of tea per day.       Exercise: none      Seatbelts:  100%      Guns: none    Review of Systems  Constitutional: Negative.   HENT: Negative.   Eyes: Negative for photophobia and visual disturbance.  Respiratory: Negative.   Cardiovascular: Negative.   Gastrointestinal: Negative.   Endocrine: Negative for polydipsia, polyphagia and polyuria.  Genitourinary: Negative.   Musculoskeletal: Negative.   Skin: Negative.   Allergic/Immunologic: Negative.   Neurological: Negative.   Hematological: Negative.   Psychiatric/Behavioral: Negative.  Negative for suicidal ideas and sleep disturbance.       Objective:   Physical Exam  Constitutional: She is oriented to person, place, and time. She appears well-developed and well-nourished.  HENT:  Head: Normocephalic and atraumatic.  Right Ear: External ear normal.  Mouth/Throat: Oropharynx is clear and moist.  Neck: Normal range of motion. Neck supple.  Cardiovascular: Normal rate, regular rhythm and normal heart sounds.   Pulmonary/Chest: Effort normal and breath sounds normal.  Abdominal: Soft. Bowel sounds are normal.  Musculoskeletal: Normal range of motion.  Neurological: She is alert and oriented to person, place, and time.  She has normal reflexes.  Skin: Skin is warm and dry.  Psychiatric: She has a normal mood and affect. Her behavior is normal. Judgment and thought content normal.        BP 142/80 mmHg  Pulse 97  Temp(Src) 98.2 F (36.8 C) (Oral)  Resp 16  Ht 5\' 6"  (1.676 m)  Wt 137 lb (62.143 kg)  BMI 22.12 kg/m2 Assessment & Plan:  1. Menopause Patient reports that she has had increased vaginal dryness, irritability, and occasional night sweats since discontinuing hormone replacement therapy. Will restart therapy today. Discussed the risks of hormone replacement therapy, patient expressed understanding.   - medroxyPROGESTERone (PROVERA) 2.5 MG  tablet; Take 1 tablet (2.5 mg total) by mouth daily.  Dispense: 30 tablet; Refill: 5 - estradiol (ESTRACE) 0.5 MG tablet; Take 1 tablet (0.5 mg total) by mouth daily.  Dispense: 30 tablet; Refill: 5   2.Constipation, unspecified constipation type Lindsey Rich that she has 2-3 formed stools per week. She reports that her diet is balanced, she is active, and only drinks water. I recommended that she increase her fluid intake to 6-8 glasses per day, add fiber to diet  ( fiber supplements, green leafy veggies, etc), walk 30 minutes 3 times per week. She has also gained 9 pounds. Will check TSH.    3. Vaginal dryness, menopausal - medroxyPROGESTERone (PROVERA) 2.5 MG tablet; Take 1 tablet (2.5 mg total) by mouth daily.  Dispense: 30 tablet; Refill: 5 - estradiol (ESTRACE) 0.5 MG tablet; Take 1 tablet (0.5 mg total) by mouth daily.  Dispense: 30 tablet; Refill: 5   4. Tobacco dependence Patient is in the pre-contemplative stage. She Rich that she has patches at home, but she has not established a quit date. Smoking cessation instruction/counseling given:  counseled patient on the dangers of tobacco use, advised patient to stop smoking, and reviewed strategies to maximize success   5. Elevated blood pressure (not hypertension) Lindsey Rich that her blood pressure is typically elevated when she goes to the doctor's office. Reviewed previous blood pressures, mildly elevated. I recommend that she measures blood pressures at home and maintain a blood pressure diary. She Rich that she does not have a blood pressure cuff. She Rich [facility]     6. History of mammography, screening  - MM DIGITAL SCREENING BILATERAL; Future    Preventative care:  Mammogram- 5 years ago Last pap 1 year ago, normal per patient .  Smoker 40 years, patches at home Refused influenza vaccination  RTC: 6 months CPE with Dr. Wynona CanesMatthews Venera Privott M, FNP

## 2014-04-09 NOTE — Patient Instructions (Signed)
Menopause Menopause is the normal time of life when menstrual periods stop completely. Menopause is complete when you have missed 12 consecutive menstrual periods. It usually occurs between the ages of 48 years and 55 years. Very rarely does a woman develop menopause before the age of 40 years. At menopause, your ovaries stop producing the female hormones estrogen and progesterone. This can cause undesirable symptoms and also affect your health. Sometimes the symptoms may occur 4-5 years before the menopause begins. There is no relationship between menopause and:  Oral contraceptives.  Number of children you had.  Race.  The age your menstrual periods started (menarche). Heavy smokers and very thin women may develop menopause earlier in life. CAUSES  The ovaries stop producing the female hormones estrogen and progesterone.  Other causes include:  Surgery to remove both ovaries.  The ovaries stop functioning for no known reason.  Tumors of the pituitary gland in the brain.  Medical disease that affects the ovaries and hormone production.  Radiation treatment to the abdomen or pelvis.  Chemotherapy that affects the ovaries. SYMPTOMS   Hot flashes.  Night sweats.  Decrease in sex drive.  Vaginal dryness and thinning of the vagina causing painful intercourse.  Dryness of the skin and developing wrinkles.  Headaches.  Tiredness.  Irritability.  Memory problems.  Weight gain.  Bladder infections.  Hair growth of the face and chest.  Infertility. More serious symptoms include:  Loss of bone (osteoporosis) causing breaks (fractures).  Depression.  Hardening and narrowing of the arteries (atherosclerosis) causing heart attacks and strokes. DIAGNOSIS   When the menstrual periods have stopped for 12 straight months.  Physical exam.  Hormone studies of the blood. TREATMENT  There are many treatment choices and nearly as many questions about them. The  decisions to treat or not to treat menopausal changes is an individual choice made with your health care provider. Your health care provider can discuss the treatments with you. Together, you can decide which treatment will work best for you. Your treatment choices may include:   Hormone therapy (estrogen and progesterone).  Non-hormonal medicines.  Treating the individual symptoms with medicine (for example antidepressants for depression).  Herbal medicines that may help specific symptoms.  Counseling by a psychiatrist or psychologist.  Group therapy.  Lifestyle changes including:  Eating healthy.  Regular exercise.  Limiting caffeine and alcohol.  Stress management and meditation.  No treatment. HOME CARE INSTRUCTIONS   Take the medicine your health care provider gives you as directed.  Get plenty of sleep and rest.  Exercise regularly.  Eat a diet that contains calcium (good for the bones) and soy products (acts like estrogen hormone).  Avoid alcoholic beverages.  Do not smoke.  If you have hot flashes, dress in layers.  Take supplements, calcium, and vitamin D to strengthen bones.  You can use over-the-counter lubricants or moisturizers for vaginal dryness.  Group therapy is sometimes very helpful.  Acupuncture may be helpful in some cases. SEEK MEDICAL CARE IF:   You are not sure you are in menopause.  You are having menopausal symptoms and need advice and treatment.  You are still having menstrual periods after age 55 years.  You have pain with intercourse.  Menopause is complete (no menstrual period for 12 months) and you develop vaginal bleeding.  You need a referral to a specialist (gynecologist, psychiatrist, or psychologist) for treatment. SEEK IMMEDIATE MEDICAL CARE IF:   You have severe depression.  You have excessive vaginal bleeding.    You fell and think you have a broken bone.  You have pain when you urinate.  You develop leg or  chest pain.  You have a fast pounding heart beat (palpitations).  You have severe headaches.  You develop vision problems.  You feel a lump in your breast.  You have abdominal pain or severe indigestion. Document Released: 04/16/2003 Document Revised: 09/26/2012 Document Reviewed: 08/23/2012 [facility] Patient Information 2015 [place], [place]. This information is not intended to replace advice given to you by your health care provider. Make sure you discuss any questions you have with your health care provider. Menopause Menopause is the normal time of life when menstrual periods stop completely. Menopause is complete when you have missed 12 consecutive menstrual periods. It usually occurs between the ages of 48 years and 55 years. Very rarely does a woman develop menopause before the age of 40 years. At menopause, your ovaries stop producing the female hormones estrogen and progesterone. This can cause undesirable symptoms and also affect your health. Sometimes the symptoms may occur 4-5 years before the menopause begins. There is no relationship between menopause and:  Oral contraceptives.  Number of children you had.  Race.  The age your menstrual periods started (menarche). Heavy smokers and very thin women may develop menopause earlier in life. CAUSES  The ovaries stop producing the female hormones estrogen and progesterone.  Other causes include:  Surgery to remove both ovaries.  The ovaries stop functioning for no known reason.  Tumors of the pituitary gland in the brain.  Medical disease that affects the ovaries and hormone production.  Radiation treatment to the abdomen or pelvis.  Chemotherapy that affects the ovaries. SYMPTOMS   Hot flashes.  Night sweats.  Decrease in sex drive.  Vaginal dryness and thinning of the vagina causing painful intercourse.  Dryness of the skin and developing wrinkles.  Headaches.  Tiredness.  Irritability.  Memory  problems.  Weight gain.  Bladder infections.  Hair growth of the face and chest.  Infertility. More serious symptoms include:  Loss of bone (osteoporosis) causing breaks (fractures).  Depression.  Hardening and narrowing of the arteries (atherosclerosis) causing heart attacks and strokes. DIAGNOSIS   When the menstrual periods have stopped for 12 straight months.  Physical exam.  Hormone studies of the blood. TREATMENT  There are many treatment choices and nearly as many questions about them. The decisions to treat or not to treat menopausal changes is an individual choice made with your health care provider. Your health care provider can discuss the treatments with you. Together, you can decide which treatment will work best for you. Your treatment choices may include:   Hormone therapy (estrogen and progesterone).  Non-hormonal medicines.  Treating the individual symptoms with medicine (for example antidepressants for depression).  Herbal medicines that may help specific symptoms.  Counseling by a psychiatrist or psychologist.  Group therapy.  Lifestyle changes including:  Eating healthy.  Regular exercise.  Limiting caffeine and alcohol.  Stress management and meditation.  No treatment. HOME CARE INSTRUCTIONS   Take the medicine your health care provider gives you as directed.  Get plenty of sleep and rest.  Exercise regularly.  Eat a diet that contains calcium (good for the bones) and soy products (acts like estrogen hormone).  Avoid alcoholic beverages.  Do not smoke.  If you have hot flashes, dress in layers.  Take supplements, calcium, and vitamin D to strengthen bones.  You can use over-the-counter lubricants or moisturizers for vaginal dryness.  Group therapy is sometimes very helpful.  Acupuncture may be helpful in some cases. SEEK MEDICAL CARE IF:   You are not sure you are in menopause.  You are having menopausal symptoms and  need advice and treatment.  You are still having menstrual periods after age 59 years.  You have pain with intercourse.  Menopause is complete (no menstrual period for 12 months) and you develop vaginal bleeding.  You need a referral to a specialist (gynecologist, psychiatrist, or psychologist) for treatment. SEEK IMMEDIATE MEDICAL CARE IF:   You have severe depression.  You have excessive vaginal bleeding.  You fell and think you have a broken bone.  You have pain when you urinate.  You develop leg or chest pain.  You have a fast pounding heart beat (palpitations).  You have severe headaches.  You develop vision problems.  You feel a lump in your breast.  You have abdominal pain or severe indigestion. Document Released: 04/16/2003 Document Revised: 09/26/2012 Document Reviewed: 08/23/2012 [facility] Patient Information 2015 [place], [place]. This information is not intended to replace advice given to you by your health care provider. Make sure you discuss any questions you have with your health care provider.

## 2014-04-10 MED ORDER — ESTRADIOL 0.5 MG PO TABS: 0.5000 mg | ORAL_TABLET | Freq: Every day | ORAL | Status: DC

## 2014-04-10 MED ORDER — MEDROXYPROGESTERONE ACETATE 2.5 MG PO TABS: 2.5000 mg | ORAL_TABLET | Freq: Every day | ORAL | Status: DC

## 2014-04-11 ENCOUNTER — Encounter: Payer: Self-pay | Admitting: Family Medicine

## 2014-04-17 ENCOUNTER — Other Ambulatory Visit: Payer: Self-pay | Admitting: Family Medicine

## 2014-04-17 DIAGNOSIS — Z1231 Encounter for screening mammogram for malignant neoplasm of breast: Secondary | ICD-10-CM

## 2014-04-30 ENCOUNTER — Ambulatory Visit
Admission: RE | Admit: 2014-04-30 | Discharge: 2014-04-30 | Disposition: A | Discharge status: Home / Self Care | Payer: No Typology Code available for payment source | Source: Ambulatory Visit | Attending: Family Medicine | Admitting: Family Medicine

## 2014-04-30 DIAGNOSIS — Z1231 Encounter for screening mammogram for malignant neoplasm of breast: Secondary | ICD-10-CM

## 2014-05-01 ENCOUNTER — Other Ambulatory Visit: Payer: Self-pay | Admitting: Family Medicine

## 2014-05-01 DIAGNOSIS — R928 Other abnormal and inconclusive findings on diagnostic imaging of breast: Secondary | ICD-10-CM

## 2014-05-02 ENCOUNTER — Ambulatory Visit
Admission: RE | Admit: 2014-05-02 | Discharge: 2014-05-02 | Disposition: A | Discharge status: Home / Self Care | Payer: No Typology Code available for payment source | Source: Ambulatory Visit | Attending: Family Medicine | Admitting: Family Medicine

## 2014-05-02 DIAGNOSIS — R928 Other abnormal and inconclusive findings on diagnostic imaging of breast: Secondary | ICD-10-CM

## 2014-05-08 ENCOUNTER — Ambulatory Visit (INDEPENDENT_AMBULATORY_CARE_PROVIDER_SITE_OTHER): Payer: No Typology Code available for payment source | Admitting: Internal Medicine

## 2014-05-08 VITALS — BP 144/82 | HR 98 | Temp 98.3°F | Resp 16 | Ht 66.0 in | Wt 136.0 lb

## 2014-05-08 DIAGNOSIS — IMO0001 Reserved for inherently not codable concepts without codable children: Secondary | ICD-10-CM

## 2014-05-08 DIAGNOSIS — K589 Irritable bowel syndrome without diarrhea: Secondary | ICD-10-CM

## 2014-05-08 DIAGNOSIS — R030 Elevated blood-pressure reading, without diagnosis of hypertension: Secondary | ICD-10-CM

## 2014-05-08 DIAGNOSIS — Z1211 Encounter for screening for malignant neoplasm of colon: Secondary | ICD-10-CM

## 2014-05-08 DIAGNOSIS — R1033 Periumbilical pain: Secondary | ICD-10-CM

## 2014-05-08 DIAGNOSIS — Z720 Tobacco use: Secondary | ICD-10-CM

## 2014-05-08 DIAGNOSIS — Z23 Encounter for immunization: Secondary | ICD-10-CM

## 2014-05-08 DIAGNOSIS — F17200 Nicotine dependence, unspecified, uncomplicated: Secondary | ICD-10-CM

## 2014-05-08 NOTE — Progress Notes (Signed)
Patient ID: Lindsey Rich, female   DOB: 10/25/1955, 59 y.o.   MRN: [id number]   Lindsey Rich, is a 59 y.o. female  [id number]  [id number]  DOB - 03/09/1955  CC:  Chief Complaint  Patient presents with  . Follow-up    follow up on constipation        HPI: Lindsey Rich is a 59 y.o. female here today to follow up on her issue of abdominal bloating and constipation with alternating diarrhea. Pt states that she occasionally gets abdominal bloating and diarrhea. She has had no weight loss and she has no associated nausea. It has not prevented her eating and she has been taking a regular diet. She states that she occasionally has peri-umbilical pain that is absent at this time. She has had no fevers or difficulty with raising her LE's.   Patient has No headache, No chest pain,- No Nausea, No new weakness tingling or numbness, No Cough - SOB.  No Known Allergies Past Medical History  Diagnosis Date  . Pure hypercholesterolemia   . Tobacco use disorder   . 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 Need for prophylactic hormone replacement therapy (postmenopausal) age 59    postmenopausal  . Genital warts   . Shingles 2005   Current Outpatient Prescriptions on File Prior to Visit  Medication Sig Dispense Refill  . estradiol (ESTRACE) 0.5 MG tablet Take 1 tablet (0.5 mg total) by mouth daily. 30 tablet 5  . medroxyPROGESTERone (PROVERA) 2.5 MG tablet Take 1 tablet (2.5 mg total) by mouth daily. 30 tablet 5  . acyclovir (ZOVIRAX) 800 MG tablet Take 1 tablet (800 mg total) by mouth 5 (five) times daily. (Patient not taking: Reported on 04/09/2014) 40 tablet 0  . meclizine (ANTIVERT) 25 MG tablet Take 1 tablet (25 mg total) by mouth 3 (three) times daily as needed for dizziness. (Patient not taking: Reported on 04/09/2014) 30 tablet 0   No current facility-administered medications on file prior to visit.   Family History  Problem Relation Age of Onset  . Alcohol abuse Mother   . Heart  attack Father   . Heart disease Father [id number]    AMI  . Hyperlipidemia Father   . Heart attack Maternal Grandmother   . Stroke Maternal Grandmother   . Cancer Sister     stomach  . Hypertension Sister   . Cancer Sister [id number]    breast cancer  . Thyroid disease Sister   . Cancer Sister 3455    lung cancer age 155.  Marland Kitchen. Thyroid disease Brother   . Hyperlipidemia Brother   . Hypertension Brother    History   Social History  . Marital Status: Married    Spouse Name: N/A  . Number of Children: 2  . Years of Education: 12   Occupational History  . housekeeper     [employer]   Social History Main Topics  . Smoking status: Current Every Day Smoker  . Smokeless tobacco: Not on file  . Alcohol Use: No  . Drug Use: No  . Sexual Activity: Yes   Other Topics Concern  . Not on file   Social History Narrative   Marital status:  Married x 27 years. Happily married; no abuse.      Lives: with husband, dog.      Children:  2 children (36, 29); 2 grandchildren (9, 5).  [place]      Employment: working part-time at [employer] in  environmental services.  Not happy.      Tobacco:  1 ppd x 47 years.      Alcohol: none      Drugs; none      Caffeine use: drinks one glasses of tea per day.       Exercise: none      Seatbelts:  100%      Guns: none    Review of Systems: Constitutional: Negative for fever, chills, diaphoresis, activity change, appetite change and fatigue. HENT: Negative for ear pain, nosebleeds, congestion, facial swelling, rhinorrhea, neck pain, neck stiffness and ear discharge.  Eyes: Negative for pain, discharge, redness, itching and visual disturbance. Respiratory: Negative for cough, choking, chest tightness, shortness of breath, wheezing and stridor.  Cardiovascular: Negative for chest pain, palpitations and leg swelling. Gastrointestinal: Negative for abdominal distention. Genitourinary: Negative for dysuria, urgency, frequency, hematuria, flank pain,  decreased urine volume, difficulty urinating and dyspareunia.  Musculoskeletal: Negative for back pain, joint swelling, arthralgia and gait problem. Neurological: Negative for dizziness, tremors, seizures, syncope, facial asymmetry, speech difficulty, weakness, light-headedness, numbness and headaches.  Hematological: Negative for adenopathy. Does not bruise/bleed easily. Psychiatric/Behavioral: Negative for hallucinations, behavioral problems, confusion, dysphoric mood, decreased concentration and agitation.     Objective:     Filed Vitals:   05/08/14 0944  BP: 144/82  Pulse: 98  Temp: 98.3 F (36.8 C)  Resp: 16    Physical Exam: Constitutional: Patient appears well-developed and well-nourished. No distress. Eyes: Conjunctivae and EOM are normal. PERRLA, no scleral icterus. Neck: Normal ROM. Neck supple. No JVD. No tracheal deviation. No thyromegaly. CVS: RRR, S1/S2 +, no murmurs, no gallops, no carotid bruit.  Pulmonary: Effort and breath sounds normal, no stridor, rhonchi, wheezes, rales.  Abdominal: Soft. BS +, no distension, tenderness, rebound or guarding.  Musculoskeletal: Normal range of motion. No edema and no tenderness.  Lymphadenopathy: No lymphadenopathy noted, cervical, inguinal or axillary Skin: Skin is warm and dry. No rash noted. Not diaphoretic. No erythema. No pallor. Psychiatric: Normal mood and affect. Behavior, judgment, thought content normal.  Lab Results  Component Value Date   WBC 10.1 11/22/2012   HGB 12.7 11/22/2012   HCT 40.5 11/22/2012   MCV 90.8 11/22/2012   PLT 266 11/22/2011   Lab Results  Component Value Date   CREATININE 0.80 11/22/2012   BUN 13 11/22/2012   NA 140 11/22/2012   K 4.3 11/22/2012   CL 106 11/22/2012   CO2 27 11/22/2012    Lab Results  Component Value Date   HGBA1C 5.6 11/22/2011   Lipid Panel     Component Value Date/Time   CHOL 208* 11/22/2011 0907   TRIG 78 11/22/2011 0907   HDL 66 11/22/2011 0907    CHOLHDL 3.2 11/22/2011 0907   VLDL 16 11/22/2011 0907   LDLCALC 126* 11/22/2011 0907       Assessment and plan:   1. Irritable bowel syndrome - Pt give a classic description of irritable bowel syndrome. She reports that her use of pear juice and a vigilant diet has resolved her issues. I have advised her to continue. - POC Hemoccult Bld/Stl (3-Cd Home Screen); Future  2. Colon cancer screening - Pt reports that she was advised by her previous Physician that it was okay to wait until age 71 to screen for Colon Cancer. I have advised patient that I am not in agreement with that recommendation and I am recommending that she undergo screening. She does not have currently have insurance and reports  that she cannot afford the colonoscopy. I have recommended stool cards. - POC Hemoccult Bld/Stl (3-Cd Home Screen); Future  3. Tobacco use disorder - Discussed tobacco cessation with Ms. Oshana. She is in the contemplative stage but wants to do this on her own wit the patches. She reports that she has the patches and is working on cutting down but has not picked her quit date. - Pneumococcal conjugate vaccine 13-valent  4. Immunization due - Pt is very resistant to immunizations. However we have had a long discussion on the need for both need for both Prevnar and Pneumovax ina patient who is a smoker. Pt then reported that she would consent to the Prevnar and take her Pneumovax in >8 weeks as she did have pneumonia this year.  - She has also had shingles and we had a limited discussion on Zostavax. - Pneumococcal conjugate vaccine 13-valent  5. Periumbilical abdominal pain - Likely related to irritable bowel syndrome. Please note that patient reports that she has had microscopic hematuria in the past which received a full workup including a Urology referral and extensive Urological testing and all was found to be negative. - Urinalysis with Culture Reflex  6. Elevated blood pressure - Pt has an  elevated BP on this visit. She has had previously elevated BP upon review of her chart. However she insists that this only occurs when she interfaces with the medical provider system- [place] HTN. She is refusing any pharmacotherapy at this time. I have negotiated with patient that she will check ambulatory BP and bring in the log on her next visit.   Return in about 3 months (around 08/07/2014) for prehypertension, Blood Pressure,  irritable bowel syndrome.  The patient was given clear instructions to go to ER or return to medical center if symptoms don't improve, worsen or new problems develop. The patient verbalized understanding. The patient was told to call to get lab results if they haven't heard anything in the next week.     This note has been created with Education officer, environmental. Any transcriptional errors are unintentional.    Thane Age A., MD [facility] [address] [place], [place] [phone]   05/08/2014, 10:40 AM

## 2014-05-08 NOTE — Patient Instructions (Signed)
Irritable Bowel Syndrome Irritable bowel syndrome (IBS) is caused by a disturbance of normal bowel function and is a common digestive disorder. You may also hear this condition called spastic colon, mucous colitis, and irritable colon. There is no cure for IBS. However, symptoms often gradually improve or disappear with a good diet, stress management, and medicine. This condition usually appears in late adolescence or early adulthood. Women develop it twice as often as men. CAUSES  After food has been digested and absorbed in the small intestine, waste material is moved into the large intestine, or colon. In the colon, water and salts are absorbed from the undigested products coming from the small intestine. The remaining residue, or fecal material, is held for elimination. Under normal circumstances, gentle, rhythmic contractions of the bowel walls push the fecal material along the colon toward the rectum. In IBS, however, these contractions are irregular and poorly coordinated. The fecal material is either retained too long, resulting in constipation, or expelled too soon, producing diarrhea. SIGNS AND SYMPTOMS  The most common symptom of IBS is abdominal pain. It is often in the lower left side of the abdomen, but it may occur anywhere in the abdomen. The pain comes from spasms of the bowel muscles happening too much and from the buildup of gas and fecal material in the colon. This pain:  Can range from sharp abdominal cramps to a dull, continuous ache.  Often worsens soon after eating.  Is often relieved by having a bowel movement or passing gas. Abdominal pain is usually accompanied by constipation, but it may also produce diarrhea. The diarrhea often occurs right after a meal or upon waking up in the morning. The stools are often soft, watery, and flecked with mucus. Other symptoms of IBS include:  Bloating.  Loss of appetite.  Heartburn.  Backache.  Dull pain in the arms or  shoulders.  Nausea.  Burping.  Vomiting.  Gas. IBS may also cause symptoms that are unrelated to the digestive system, such as:  Fatigue.  Headaches.  Anxiety.  Shortness of breath.  Trouble concentrating.  Dizziness. These symptoms tend to come and go. DIAGNOSIS  The symptoms of IBS may seem like symptoms of other, more serious digestive disorders. Your health care provider may want to perform tests to exclude these disorders.  TREATMENT Many medicines are available to help correct bowel function or relieve bowel spasms and abdominal pain. Among the medicines available are:  Laxatives for severe constipation and to help restore normal bowel habits.  Specific antidiarrheal medicines to treat severe or lasting diarrhea.  Antispasmodic agents to relieve intestinal cramps. Your health care provider may also decide to treat you with a mild tranquilizer or sedative during unusually stressful periods in your life. Your health care provider may also prescribe antidepressant medicine. The use of this medicine has been shown to reduce pain and other symptoms of IBS. Remember that if any medicine is prescribed for you, you should take it exactly as directed. Make sure your health care provider knows how well it worked for you. HOME CARE INSTRUCTIONS   Take all medicines as directed by your health care provider.  Avoid foods that are high in fat or oils, such as heavy cream, butter, frankfurters, sausage, and other fatty meats.  Avoid foods that make you go to the bathroom, such as fruit, fruit juice, and dairy products.  Cut out carbonated drinks, chewing gum, and "gassy" foods such as beans and cabbage. This may help relieve bloating and burping.    Eat foods with bran, and drink plenty of liquids with the bran foods. This helps relieve constipation.  Keep track of what foods seem to bring on your symptoms.  Avoid emotionally charged situations or circumstances that produce  anxiety.  Start or continue exercising.  Get plenty of rest and sleep. Document Released: 01/24/2005 Document Revised: 01/29/2013 Document Reviewed: 09/14/2007 ExitCare Patient Information 2015 [employer], [employer]. This information is not intended to replace advice given to you by your health care provider. Make sure you discuss any questions you have with your health care provider.  

## 2014-05-09 ENCOUNTER — Encounter: Payer: Self-pay | Admitting: Internal Medicine

## 2014-05-09 DIAGNOSIS — R030 Elevated blood-pressure reading, without diagnosis of hypertension: Secondary | ICD-10-CM

## 2014-05-09 DIAGNOSIS — Z1211 Encounter for screening for malignant neoplasm of colon: Secondary | ICD-10-CM | POA: Insufficient documentation

## 2014-05-09 DIAGNOSIS — R1033 Periumbilical pain: Secondary | ICD-10-CM | POA: Insufficient documentation

## 2014-05-09 DIAGNOSIS — F17200 Nicotine dependence, unspecified, uncomplicated: Secondary | ICD-10-CM | POA: Insufficient documentation

## 2014-05-09 DIAGNOSIS — IMO0001 Reserved for inherently not codable concepts without codable children: Secondary | ICD-10-CM | POA: Insufficient documentation

## 2014-05-09 LAB — URINALYSIS W MICROSCOPIC + REFLEX CULTURE
BACTERIA UA: NONE SEEN
Bilirubin Urine: NEGATIVE
CASTS: NONE SEEN
Crystals: NONE SEEN
GLUCOSE, UA: NEGATIVE mg/dL
HGB URINE DIPSTICK: NEGATIVE
KETONES UR: NEGATIVE mg/dL
Leukocytes, UA: NEGATIVE
Nitrite: NEGATIVE
PROTEIN: NEGATIVE mg/dL
Specific Gravity, Urine: <1.005 — ABNORMAL LOW (ref 1.005–1.030)
Squamous Epithelial / LPF: NONE SEEN
Urobilinogen, UA: 0.2 mg/dL (ref 0.0–1.0)
pH: 6 (ref 5.0–8.0)

## 2014-05-22 LAB — POC HEMOCCULT BLD/STL (HOME/3-CARD/SCREEN)
Card #2 Fecal Occult Blod, POC: NEGATIVE
Card #3 Date: 4142016
Card #3 Fecal Occult Blood, POC: NEGATIVE
Fecal Occult Blood, POC: NEGATIVE

## 2014-08-01 ENCOUNTER — Ambulatory Visit: Payer: No Typology Code available for payment source | Admitting: Family Medicine

## 2014-08-08 ENCOUNTER — Ambulatory Visit: Payer: No Typology Code available for payment source | Admitting: Family Medicine

## 2014-08-20 ENCOUNTER — Encounter: Payer: Self-pay | Admitting: Family Medicine

## 2014-08-20 ENCOUNTER — Ambulatory Visit (INDEPENDENT_AMBULATORY_CARE_PROVIDER_SITE_OTHER): Payer: No Typology Code available for payment source | Admitting: Family Medicine

## 2014-08-20 VITALS — BP 142/86 | HR 86 | Temp 98.2°F | Resp 16 | Ht 66.0 in | Wt 128.0 lb

## 2014-08-20 DIAGNOSIS — R030 Elevated blood-pressure reading, without diagnosis of hypertension: Secondary | ICD-10-CM

## 2014-08-20 DIAGNOSIS — Z780 Asymptomatic menopausal state: Secondary | ICD-10-CM

## 2014-08-20 DIAGNOSIS — IMO0001 Reserved for inherently not codable concepts without codable children: Secondary | ICD-10-CM

## 2014-08-20 LAB — CBC WITH DIFFERENTIAL/PLATELET
BASOS ABS: 0 10*3/uL (ref 0.0–0.1)
Basophils Relative: 0 % (ref 0–1)
EOS PCT: 2 % (ref 0–5)
Eosinophils Absolute: 0.2 10*3/uL (ref 0.0–0.7)
HCT: 43.2 % (ref 36.0–46.0)
Hemoglobin: 14.3 g/dL (ref 12.0–15.0)
Lymphocytes Relative: 28 % (ref 12–46)
Lymphs Abs: 2.9 10*3/uL (ref 0.7–4.0)
MCH: 28.7 pg (ref 26.0–34.0)
MCHC: 33.1 g/dL (ref 30.0–36.0)
MCV: 86.7 fL (ref 78.0–100.0)
MONO ABS: 0.6 10*3/uL (ref 0.1–1.0)
MPV: 9.7 fL (ref 8.6–12.4)
Monocytes Relative: 6 % (ref 3–12)
Neutro Abs: 6.7 10*3/uL (ref 1.7–7.7)
Neutrophils Relative %: 64 % (ref 43–77)
Platelets: 282 10*3/uL (ref 150–400)
RBC: 4.98 MIL/uL (ref 3.87–5.11)
RDW: 14.6 % (ref 11.5–15.5)
WBC: 10.4 10*3/uL (ref 4.0–10.5)

## 2014-08-20 LAB — LIPID PANEL
Cholesterol: 185 mg/dL (ref 0–200)
HDL: 74 mg/dL (ref >=46)
LDL CALC: 95 mg/dL (ref 0–99)
Total CHOL/HDL Ratio: 2.5 Ratio
Triglycerides: 78 mg/dL (ref <150)
VLDL: 16 mg/dL (ref 0–40)

## 2014-08-20 LAB — COMPLETE METABOLIC PANEL WITH GFR
ALBUMIN: 4.1 g/dL (ref 3.5–5.2)
ALT: 12 U/L (ref 0–35)
AST: 17 U/L (ref 0–37)
Alkaline Phosphatase: 77 U/L (ref 39–117)
BUN: 10 mg/dL (ref 6–23)
CO2: 25 mEq/L (ref 19–32)
Calcium: 9.7 mg/dL (ref 8.4–10.5)
Chloride: 104 mEq/L (ref 96–112)
Creat: 0.88 mg/dL (ref 0.50–1.10)
GFR, EST AFRICAN AMERICAN: 84 mL/min
GFR, Est Non African American: 73 mL/min
Glucose, Bld: 84 mg/dL (ref 70–99)
POTASSIUM: 4.3 meq/L (ref 3.5–5.3)
Sodium: 141 mEq/L (ref 135–145)
TOTAL PROTEIN: 6.8 g/dL (ref 6.0–8.3)
Total Bilirubin: 0.5 mg/dL (ref 0.2–1.2)

## 2014-08-20 MED ORDER — ESTRADIOL 0.5 MG PO TABS: 0.5000 mg | ORAL_TABLET | Freq: Every day | ORAL | Status: AC

## 2014-08-20 MED ORDER — MEDROXYPROGESTERONE ACETATE 2.5 MG PO TABS: 2.5000 mg | ORAL_TABLET | Freq: Every day | ORAL | Status: AC

## 2014-08-20 NOTE — Patient Instructions (Signed)
Continue with healthy lifestyle including diet low in fats, cholesterol, carbs. High in fresh fruits and vegetable and lean non-fried meats. Try to walk for 30 minutes 5/7 days.

## 2014-08-20 NOTE — Progress Notes (Signed)
Patient ID: Lindsey Rich, female   DOB: [birth year], 59 y.o.   MRN: [id number]   Lindsey Rich, is a 59 y.o. female  [id number]:[id number]  [id number]  DOB - 1955-07-12  CC:  Chief Complaint  Patient presents with  . Follow-up  . Hypertension       HPI: Lindsey Rich is a 59 y.o. female here for follow-up on chronic conditions including hypertension, Tobacco use. Hormone replacement. Hyperlipidemia. She is here today for follow-up of BP. She has been reluctant to start medication for hypertension. She reports she has just started patches for smoking cessation and hopes that will bring her pressure to below 140/90 (Is 142/86 today). When she was seen last she was experiencing constipation but that has resolved. She denies any symptoms suggestive of IBS. She  No Known Allergies Past Medical History  Diagnosis Date  . Pure hypercholesterolemia   . Tobacco use disorder   . 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 Need for prophylactic hormone replacement therapy (postmenopausal) age 18    postmenopausal  . Genital warts   . Shingles 2005   Current Outpatient Prescriptions on File Prior to Visit  Medication Sig Dispense Refill  . estradiol (ESTRACE) 0.5 MG tablet Take 1 tablet (0.5 mg total) by mouth daily. 30 tablet 5  . medroxyPROGESTERone (PROVERA) 2.5 MG tablet Take 1 tablet (2.5 mg total) by mouth daily. 30 tablet 5  . acyclovir (ZOVIRAX) 800 MG tablet Take 1 tablet (800 mg total) by mouth 5 (five) times daily. (Patient not taking: Reported on 04/09/2014) 40 tablet 0  . meclizine (ANTIVERT) 25 MG tablet Take 1 tablet (25 mg total) by mouth 3 (three) times daily as needed for dizziness. (Patient not taking: Reported on 04/09/2014) 30 tablet 0   No current facility-administered medications on file prior to visit.   Family History  Problem Relation Age of Onset  . Alcohol abuse Mother   . Heart attack Father   . Heart disease Father 53    AMI  . Hyperlipidemia Father   . Heart attack  Maternal Grandmother   . Stroke Maternal Grandmother   . Cancer Sister     stomach  . Hypertension Sister   . Cancer Sister 37    breast cancer  . Thyroid disease Sister   . Cancer Sister 31    lung cancer age 55.  Marland Kitchen Thyroid disease Brother   . Hyperlipidemia Brother   . Hypertension Brother    History   Social History  . Marital Status: Married    Spouse Name: N/A  . Number of Children: 2  . Years of Education: 12   Occupational History  . housekeeper     [employer]   Social History Main Topics  . Smoking status: Current Every Day Smoker  . Smokeless tobacco: Not on file  . Alcohol Use: No  . Drug Use: No  . Sexual Activity: Yes   Other Topics Concern  . Not on file   Social History Narrative   Marital status:  Married x 27 years. Happily married; no abuse.      Lives: with husband, dog.      Children:  2 children (36, 29); 2 grandchildren (9, 5).  [place] and [place].      Employment: working part-time at [employer] in [employer].  Not happy.      Tobacco:  1 ppd x 47 years.      Alcohol: none  Drugs; none      Caffeine use: drinks one glasses of tea per day.       Exercise: none      Seatbelts:  100%      Guns: none    Review of Systems: Constitutional: Negative for fever, chills, appetite change, weight loss,  fatigue. HENT: Negative for ear pain, ear discharge.nose bleeds Eyes: Negative for pain, discharge, redness, itching and visual disturbance. Neck: Negative for pain, stiffness Respiratory: Negative for cough, shortness of breath,   Cardiovascular: Negative for chest pain, palpitations and leg swelling. Gastrointestinal: Negative for abdominal distention, abdominal pain, nausea, vomiting, diarrhea, constipations Genitourinary: Negative for dysuria, urgency, frequency, hematuria, flank pain,  Musculoskeletal: Negative for back pain, joint pain, joint  swelling, arthralgia and gait problem.Negative for weakness. Neurological:  Negative for dizziness, tremors, seizures, syncope,   light-headedness, numbness and headaches.  Hematological: Negative for easy bruising or bleeding Psychiatric/Behavioral: Negative for depression, anxiety, decreased concentration, confusion   Objective:   Filed Vitals:   08/20/14 1014  BP: 98/73  Pulse: 86  Temp: 98.2 F (36.8 C)  Resp: 16    Physical Exam: Constitutional: Patient appears well-developed and well-nourished. No distress. HENT: Normocephalic, atraumatic, External right and left ear normal. Oropharynx is clear and moist.  Eyes: Conjunctivae and EOM are normal. PERRLA, no scleral icterus. Neck: Normal ROM. Neck supple. No lymphadenopathy, No thyromegaly. CVS: RRR, S1/S2 +, no murmurs, no gallops, no rubs Pulmonary: Effort and breath sounds normal, no stridor, rhonchi, wheezes, rales.  Abdominal: Soft. Normoactive BS,, no distension, tenderness, rebound or guarding.  Musculoskeletal: Normal range of motion. No edema and no tenderness.  Neuro: Alert.Normal muscle tone coordination. Non-focal Skin: Skin is warm and dry. No rash noted. Not diaphoretic. No erythema. No pallor. Psychiatric: Normal mood and affect. Behavior, judgment, thought content normal.  Lab Results  Component Value Date   WBC 10.1 11/22/2012   HGB 12.7 11/22/2012   HCT 40.5 11/22/2012   MCV 90.8 11/22/2012   PLT 266 11/22/2011   Lab Results  Component Value Date   CREATININE 0.80 11/22/2012   BUN 13 11/22/2012   NA 140 11/22/2012   K 4.3 11/22/2012   CL 106 11/22/2012   CO2 27 11/22/2012    Lab Results  Component Value Date   HGBA1C 5.6 11/22/2011   Lipid Panel     Component Value Date/Time   CHOL 208* 11/22/2011 0907   TRIG 78 11/22/2011 0907   HDL 66 11/22/2011 0907   CHOLHDL 3.2 11/22/2011 0907   VLDL 16 11/22/2011 0907   LDLCALC 126* 11/22/2011 0907       Assessment and plan:   Hypertension Cmet with GFR, CBC, TSH - continue with smoking cessation -follow-up in 3  months.  Menopausal symptoms -Continue hormone replacement therapy  Hypercholesterolemia -Lipid panel    The patient was given clear instructions to go to ER or return to medical center if symptoms don't improve, worsen or new problems develop. The patient verbalized understanding. The patient was told to call to get lab results if they haven't heard anything in the next week.     This note has been created with Education officer, [employer]Dragon speech recognition software and smart phrase technology. Any transcriptional errors are unintentional.    Henrietta HooverLinda C. Averey Koning, MSN, FNP-BC   08/20/2014, 10:17 AM

## 2014-09-29 ENCOUNTER — Other Ambulatory Visit: Payer: Self-pay

## 2014-10-03 ENCOUNTER — Encounter: Payer: Self-pay | Admitting: Family Medicine

## 2014-10-03 ENCOUNTER — Ambulatory Visit (INDEPENDENT_AMBULATORY_CARE_PROVIDER_SITE_OTHER): Payer: No Typology Code available for payment source | Admitting: Family Medicine

## 2014-10-03 VITALS — BP 129/74 | HR 104 | Temp 98.3°F | Resp 16 | Ht 66.0 in | Wt 125.0 lb

## 2014-10-03 DIAGNOSIS — J209 Acute bronchitis, unspecified: Secondary | ICD-10-CM

## 2014-10-03 MED ORDER — CLARITHROMYCIN 500 MG PO TABS: 500.0000 mg | ORAL_TABLET | Freq: Two times a day (BID) | ORAL | Status: AC

## 2014-10-03 NOTE — Progress Notes (Signed)
Patient ID: Lindsey Rich, female   DOB: [birth year], 59 y.o.   MRN: [id number]   Lindsey Rich, is a 59 y.o. female  [id number]  [id number]  DOB - 1955-09-06  CC:  Chief Complaint  Patient presents with  . Cough    coughing x 12 days        HPI: Lindsey Rich is a 59 y.o. female here for cough for 12 days.  This started with ST, nasal congestion, low grade fever, body aches 12 days ago and then progressed to a cough which is not improving. She reports her shoulder and chest hurt from coughing.  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 She has been using OTC cough medication for the cough. She also has a history of hypercholesterolemia, menopausal symptoms, genital warts and shingles.   No Known Allergies Past Medical History  Diagnosis Date  . Pure hypercholesterolemia   . Tobacco use disorder   . 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 Need for prophylactic hormone replacement therapy (postmenopausal) age 41    postmenopausal  . Genital warts   . Shingles 2005   Current Outpatient Prescriptions on File Prior to Visit  Medication Sig Dispense Refill  . estradiol (ESTRACE) 0.5 MG tablet Take 1 tablet (0.5 mg total) by mouth daily. 30 tablet 5  . medroxyPROGESTERone (PROVERA) 2.5 MG tablet Take 1 tablet (2.5 mg total) by mouth daily. 30 tablet 5  . acyclovir (ZOVIRAX) 800 MG tablet Take 1 tablet (800 mg total) by mouth 5 (five) times daily. (Patient not taking: Reported on 04/09/2014) 40 tablet 0  . meclizine (ANTIVERT) 25 MG tablet Take 1 tablet (25 mg total) by mouth 3 (three) times daily as needed for dizziness. (Patient not taking: Reported on 04/09/2014) 30 tablet 0   No current facility-administered medications on file prior to visit.   Family History  Problem Relation Age of Onset  . Alcohol abuse Mother   . Heart attack Father   . Heart disease Father 33    AMI  . Hyperlipidemia Father   . Heart attack Maternal Grandmother   . Stroke Maternal  Grandmother   . Cancer Sister     stomach  . Hypertension Sister   . Cancer Sister 72    breast cancer  . Thyroid disease Sister   . Cancer Sister 37    lung cancer age 57.  Marland Kitchen Thyroid disease Brother   . Hyperlipidemia Brother   . Hypertension Brother    Social History   Social History  . Marital Status: Married    Spouse Name: N/A  . Number of Children: 2  . Years of Education: 12   Occupational History  . housekeeper     [employer]   Social History Main Topics  . Smoking status: Current Every Day Smoker  . Smokeless tobacco: Not on file  . Alcohol Use: No  . Drug Use: No  . Sexual Activity: Yes   Other Topics Concern  . Not on file   Social History Narrative   Marital status:  Married x 27 years. Happily married; no abuse.      Lives: with husband, dog.      Children:  2 children (36, 29); 2 grandchildren (9, 5).  [place] and [place].      Employment: working part-time at [employer] in [employer].  Not happy.      Tobacco:  1 ppd x 47 years.  Alcohol: none      Drugs; none      Caffeine use: drinks one glasses of tea per day.       Exercise: none      Seatbelts:  100%      Guns: none    Review of Systems: Constitutional: Negative for fever, chills, appetite change, weight loss,  Fatigue. Skin: Negative for rashes or lesions of concern. HENT: Negative for ear pain, ear discharge.nose bleeds. Positive for ST which has resolved, nasal congestion which continues but is better Eyes: Negative for pain, discharge, redness, itching and visual disturbance. Neck: Negative for pain, stiffness Respiratory: Positive  for cough, shortness of breath,  Some wheezes Cardiovascular: Negative for chest pain, palpitations and leg swelling. Gastrointestinal: Negative for abdominal pain, nausea, vomiting, diarrhea, constipations Genitourinary: Negative for dysuria, urgency, frequency, hematuria,  Musculoskeletal: Negative for back pain, joint pain, joint   swelling, and gait problem.Negative for weakness. Neurological: Negative for dizziness, tremors, seizures, syncope,   light-headedness, numbness. Positive for occassional headache during this illness. Hematological: Negative for easy bruising or bleeding Psychiatric/Behavioral: Negative for depression, anxiety, decreased concentration, confusion   Objective:   Filed Vitals:   10/03/14 1312  BP: 129/74  Pulse: 104  Temp: 98.3 F (36.8 C)  Resp: 16    Physical Exam: Constitutional: Patient appears well-developed and well-nourished. No distress.Cough frequently, sounds moist HENT: Normocephalic, atraumatic, External right and left ear normal. Oropharynx is clear and moist.  Eyes: Conjunctivae and EOM are normal. PERRLA, no scleral icterus. Neck: Normal ROM. Neck supple. No lymphadenopathy, No thyromegaly. CVS: RRR, S1/S2 +, no murmurs, no gallops, no rubs Pulmonary: Effort and breath sounds normal, no stridor. There are coarse rales in the bases posteriorly bilaterally. Abdominal: Soft. Normoactive BS,, no distension, tenderness, rebound or guarding.  Musculoskeletal: Normal range of motion. No edema and no tenderness.  Neuro: Alert.Normal muscle tone coordination. Non-focal Skin: Skin is warm and dry. No rash noted. Not diaphoretic. No erythema. No pallor. Psychiatric: Normal mood and affect. Behavior, judgment, thought content normal.  Lab Results  Component Value Date   WBC 10.4 08/20/2014   HGB 14.3 08/20/2014   HCT 43.2 08/20/2014   MCV 86.7 08/20/2014   PLT 282 08/20/2014   Lab Results  Component Value Date   CREATININE 0.88 08/20/2014   BUN 10 08/20/2014   NA 141 08/20/2014   K 4.3 08/20/2014   CL 104 08/20/2014   CO2 25 08/20/2014    Lab Results  Component Value Date   HGBA1C 5.6 11/22/2011   Lipid Panel     Component Value Date/Time   CHOL 185 08/20/2014 1045   TRIG 78 08/20/2014 1045   HDL 74 08/20/2014 1045   CHOLHDL 2.5 08/20/2014 1045   VLDL 16  08/20/2014 1045   LDLCALC 95 08/20/2014 1045       Assessment and plan:   1. Acute bronchitis, unspecified organism -Biaxin 500 mg bid -lots of fluids -OTC cough medications as needed -Follow-up as needed   Return if symptoms worsen or fail to improve.  The patient was given clear instructions to go to ER or return to medical center if symptoms don't improve, worsen or new problems develop. The patient verbalized understanding.    Henrietta Hoover FNP  10/03/2014, 1:33 PM

## 2014-10-03 NOTE — Patient Instructions (Signed)
Drink plenty of liquids Take antibiotic 2 times a day for 2 weeks. Follow-up as needed.

## 2014-10-06 ENCOUNTER — Encounter: Payer: Self-pay | Admitting: Family Medicine

## 2016-07-30 IMAGING — CR DG SHOULDER 2+V*L*
3 series · 3 of 3 positions shown · non-contrast
Comparison: None.

CLINICAL DATA: Lifting injury left shoulder 02/10/2014. Left
shoulder pain and limited range of motion. Initial encounter.

EXAM:
LEFT SHOULDER - 2+ VIEW

[view not recorded (1 of 3)]
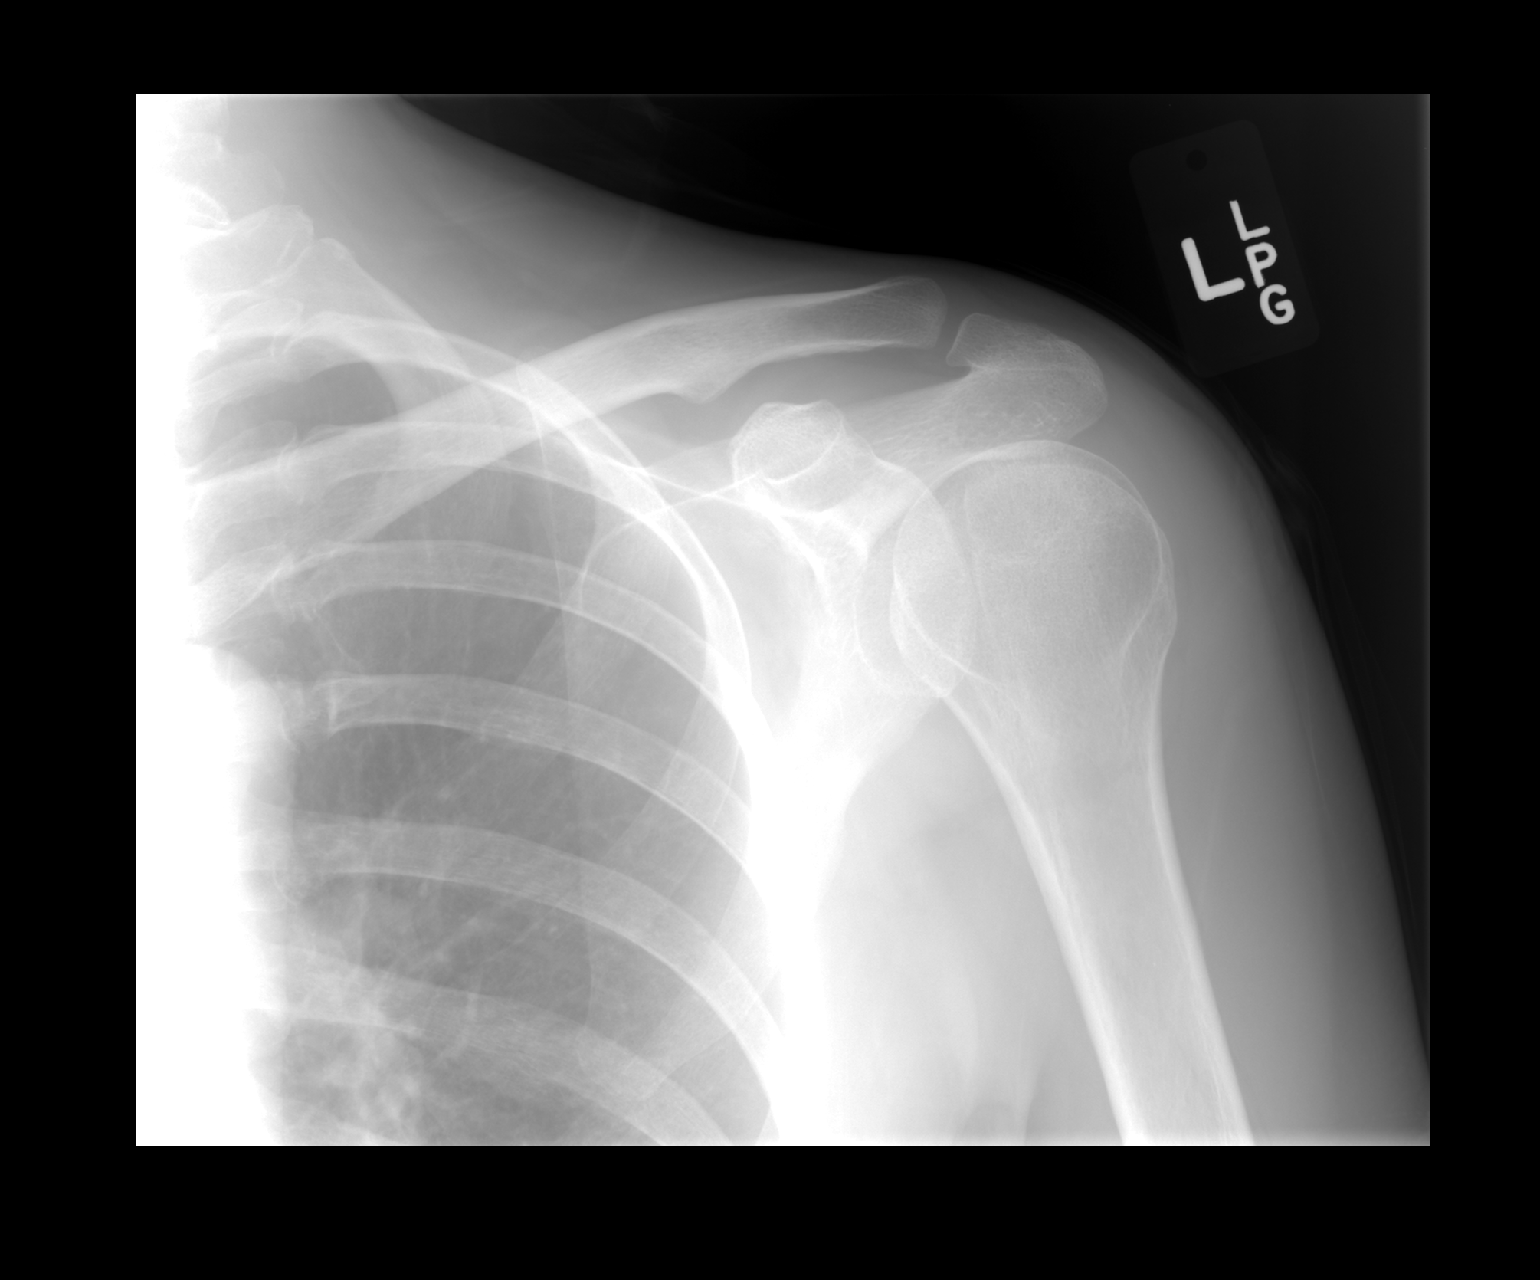

[view not recorded (2 of 3)]
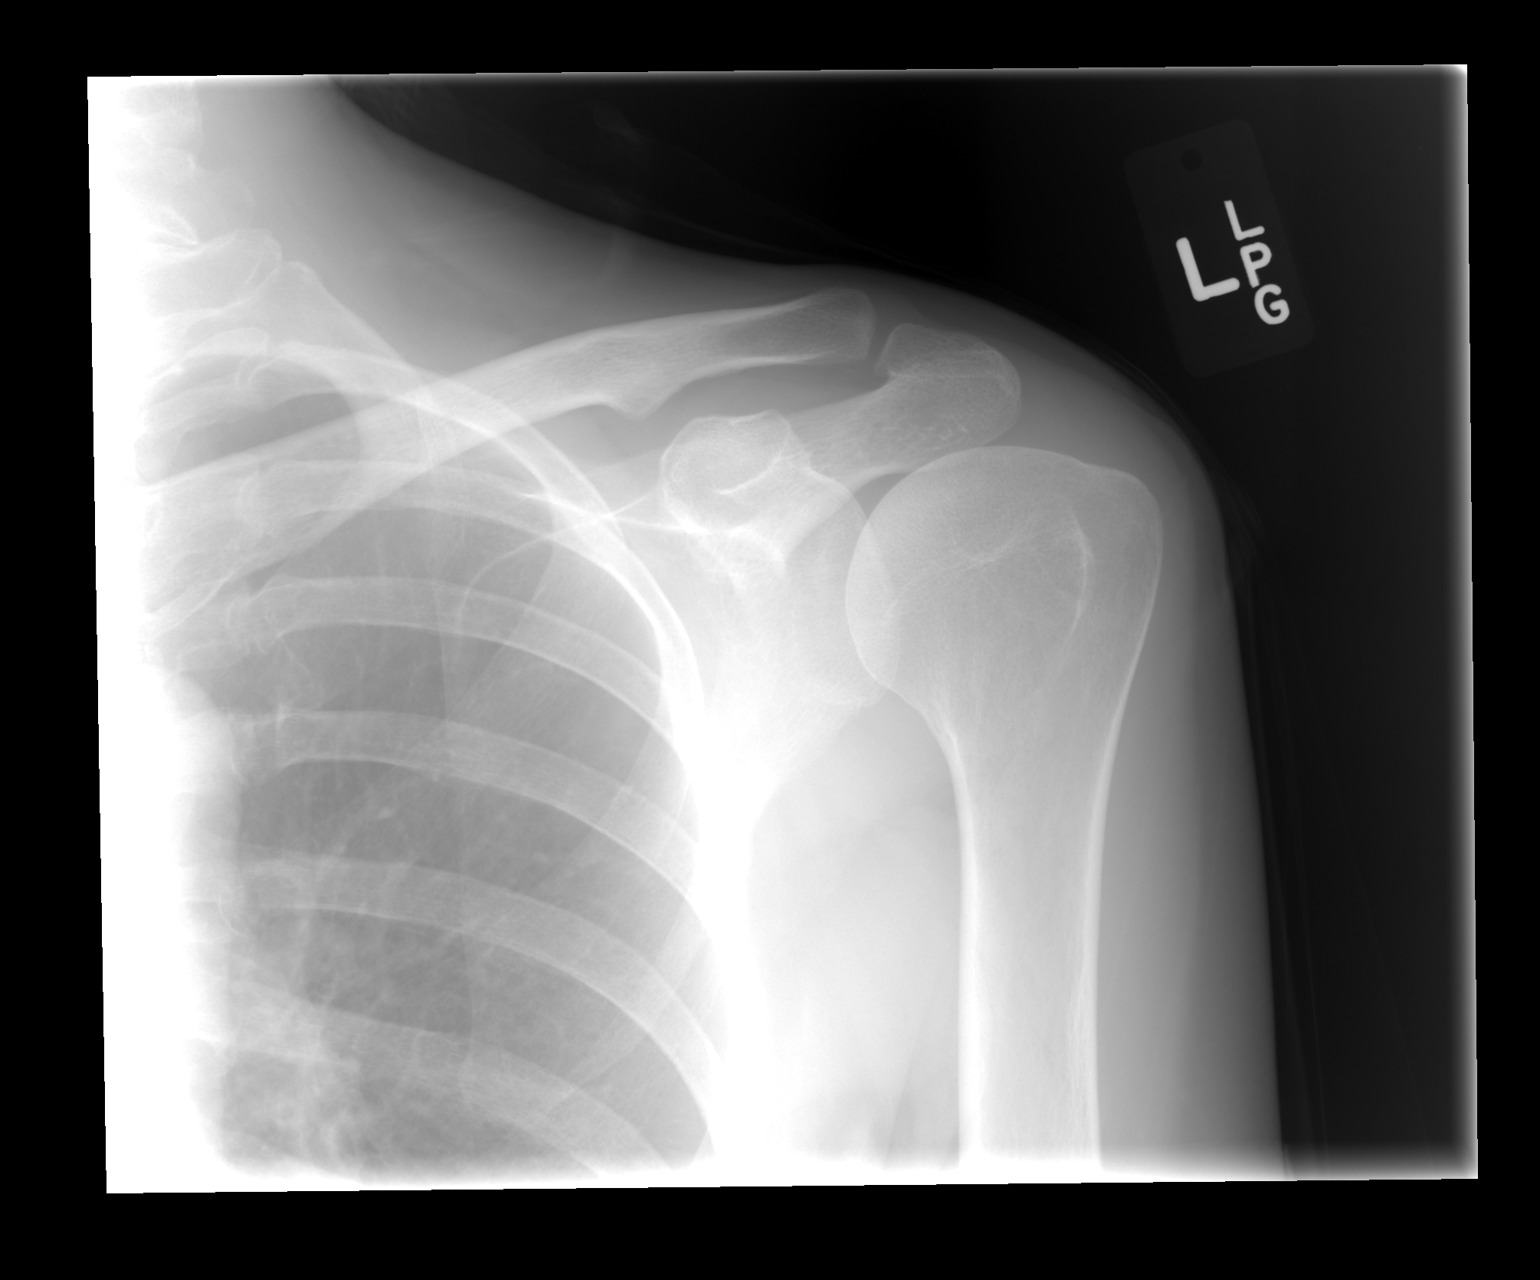

[view not recorded (3 of 3)]
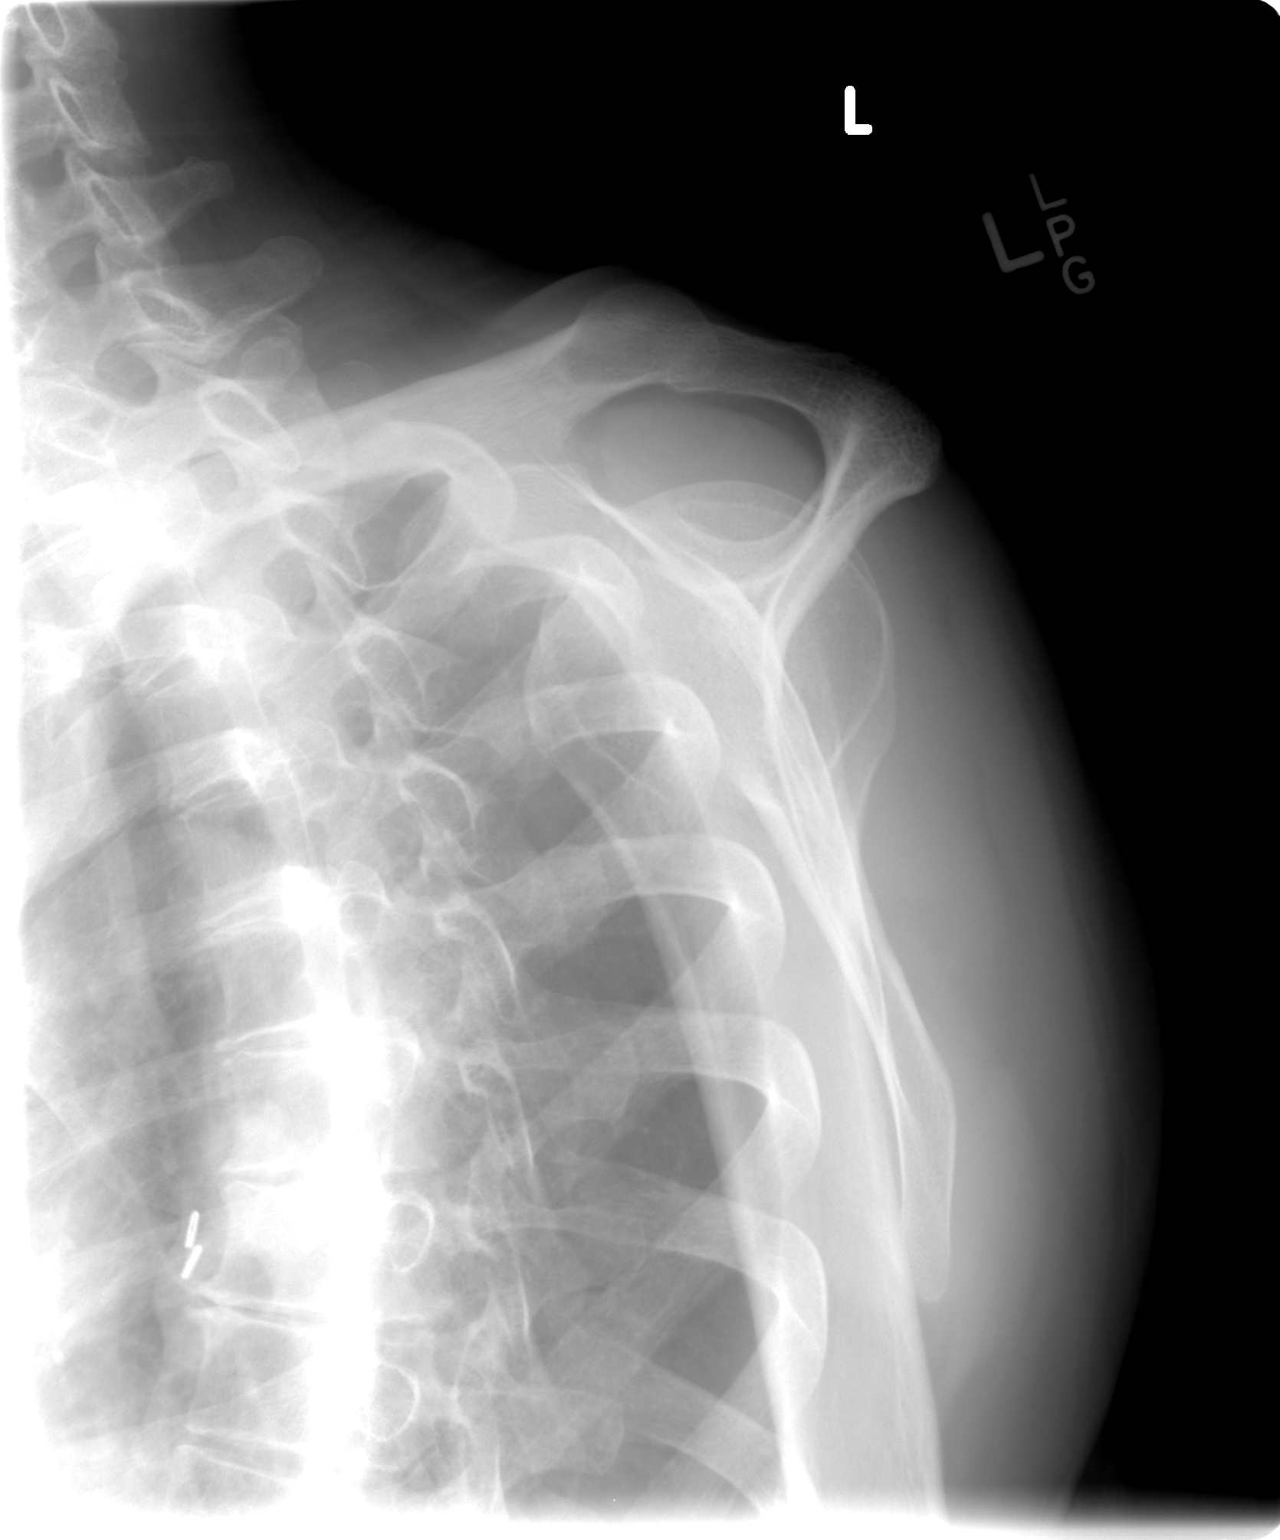

[3 of 3 positions shown; findings below may reference images not displayed]

FINDINGS: The humerus is located and the acromioclavicular joint is intact.
There is no fracture. No focal bony lesion is identified. Imaged
left lung and ribs appear normal. Surgical clips in the left hilum
are noted.
IMPRESSION: Normal-appearing left shoulder.
